# Patient Record
Sex: Female | Born: 1970 | Race: White | Hispanic: No | Marital: Single | Smoking: Current every day smoker
Health system: Southern US, Community
[De-identification: ages and names within clinical notes are randomized; demographics above are authoritative.]

## PROBLEM LIST (undated history)

## (undated) DIAGNOSIS — I10 Essential (primary) hypertension: Secondary | ICD-10-CM

## (undated) DIAGNOSIS — F329 Major depressive disorder, single episode, unspecified: Secondary | ICD-10-CM

## (undated) DIAGNOSIS — F32A Depression, unspecified: Secondary | ICD-10-CM

## (undated) DIAGNOSIS — F419 Anxiety disorder, unspecified: Secondary | ICD-10-CM

---

## 2005-01-04 ENCOUNTER — Observation Stay: Payer: Self-pay | Admitting: Obstetrics and Gynecology

## 2005-01-28 ENCOUNTER — Ambulatory Visit: Payer: Self-pay

## 2005-04-05 ENCOUNTER — Observation Stay: Payer: Self-pay | Admitting: Obstetrics and Gynecology

## 2006-07-10 ENCOUNTER — Emergency Department: Payer: Self-pay | Admitting: Emergency Medicine

## 2006-07-10 ENCOUNTER — Other Ambulatory Visit: Payer: Self-pay

## 2006-08-10 ENCOUNTER — Ambulatory Visit: Payer: Self-pay | Admitting: Family Medicine

## 2006-08-28 ENCOUNTER — Other Ambulatory Visit: Payer: Self-pay

## 2006-08-28 ENCOUNTER — Emergency Department: Payer: Self-pay | Admitting: Unknown Physician Specialty

## 2007-07-10 ENCOUNTER — Emergency Department: Payer: Self-pay | Admitting: Unknown Physician Specialty

## 2007-10-28 ENCOUNTER — Emergency Department: Payer: Self-pay | Admitting: Internal Medicine

## 2008-01-11 ENCOUNTER — Emergency Department: Payer: Self-pay | Admitting: Emergency Medicine

## 2008-05-08 ENCOUNTER — Emergency Department: Payer: Self-pay | Admitting: Emergency Medicine

## 2008-06-22 ENCOUNTER — Ambulatory Visit: Payer: Self-pay | Admitting: Internal Medicine

## 2008-11-08 IMAGING — US US RENAL KIDNEY
1 series · 17 of 25 positions shown · non-contrast
Comparison: none

REASON FOR EXAM: Pelvic tenderness, history of ovarian cyst, hematuria,
flank pain
COMMENTS:

PROCEDURE:     US  - US KIDNEY  - June 22, 2008  [DATE]
RESULT:     The kidneys exhibit normal echotexture and contour. The RIGHT
kidney measures 10.0 cm in length as does the LEFT kidney. There is no
evidence of hydronephrosis. Survey views of urinary bladder are normal.

[Series 1: us renal kidney · 17 of 25 slices shown]
[im 1/25]
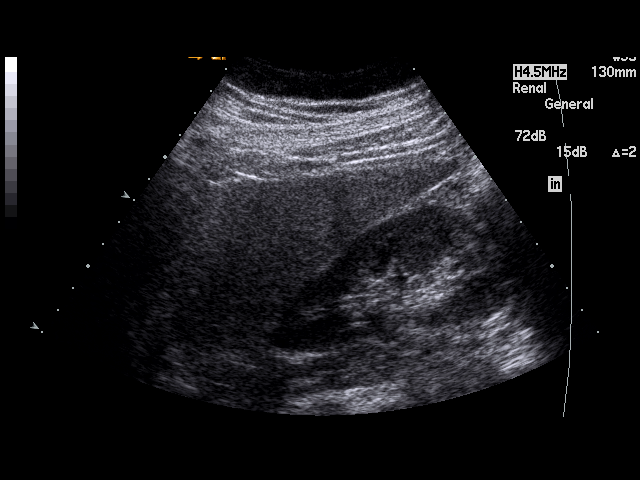
[im 3/25]
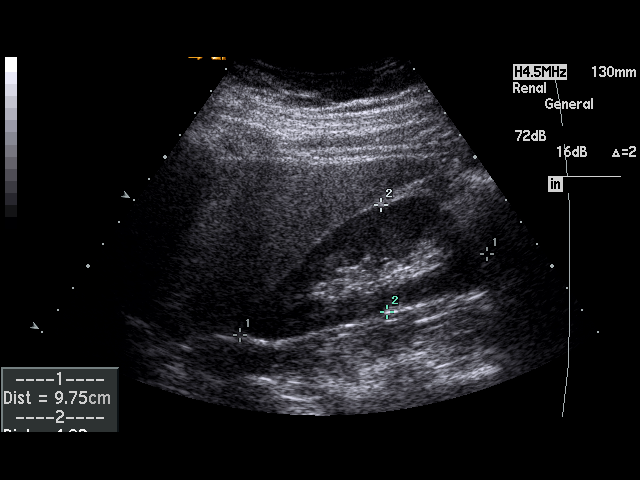
[im 4/25]
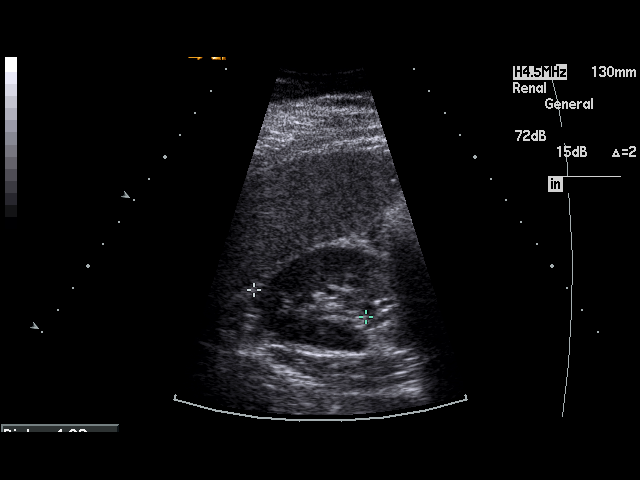
[im 6/25]
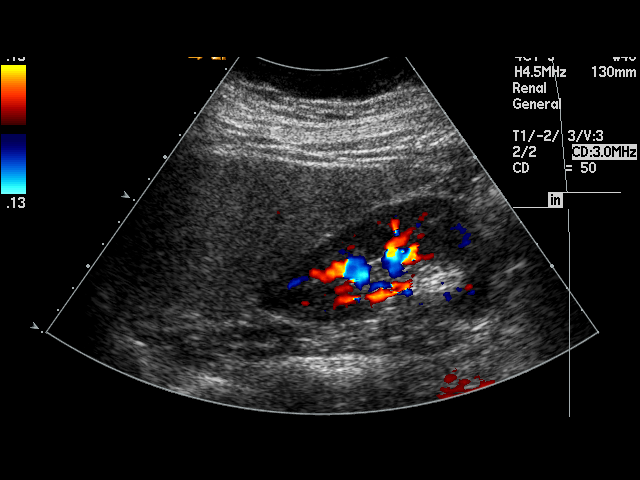
[im 7/25]
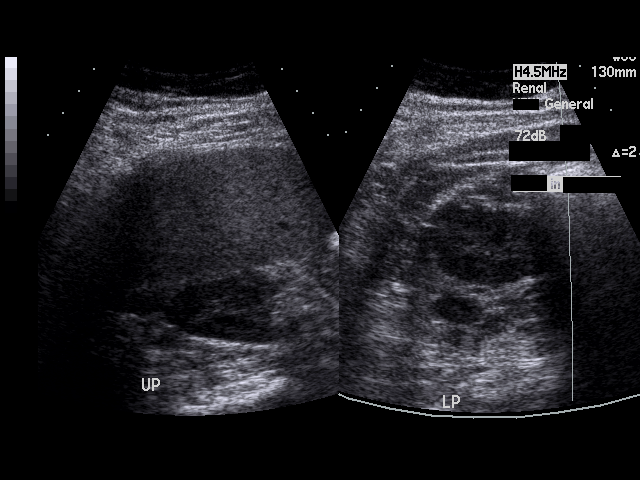
[im 9/25]
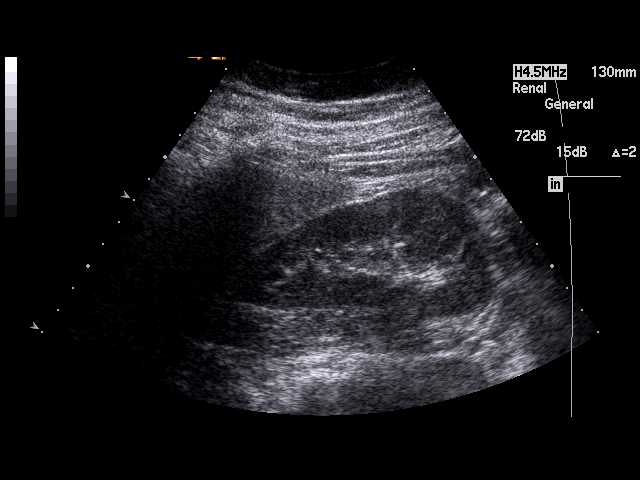
[im 10/25]
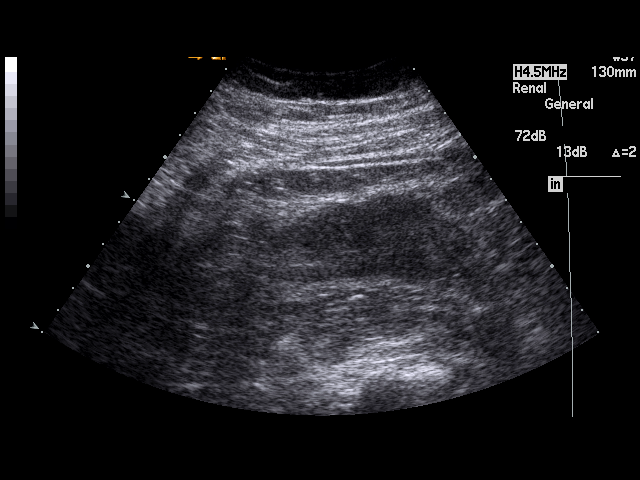
[im 12/25]
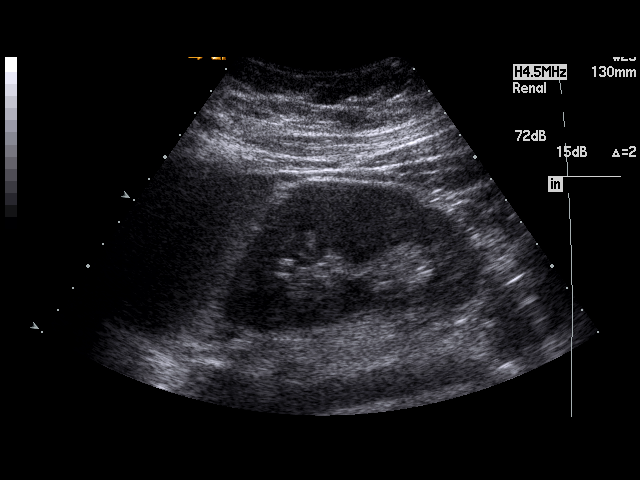
[im 13/25]
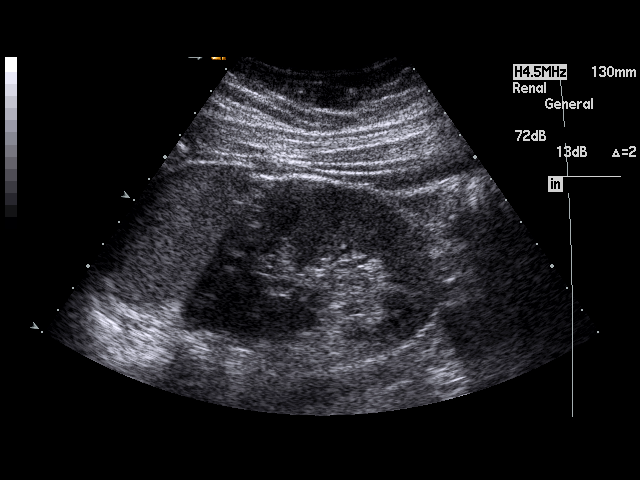
[im 14/25]
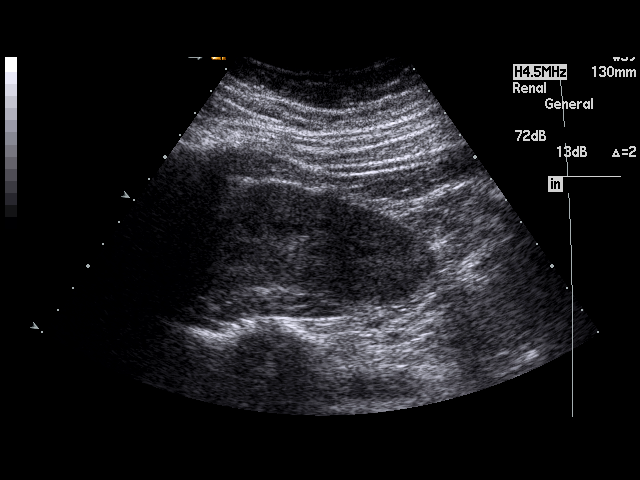
[im 16/25]
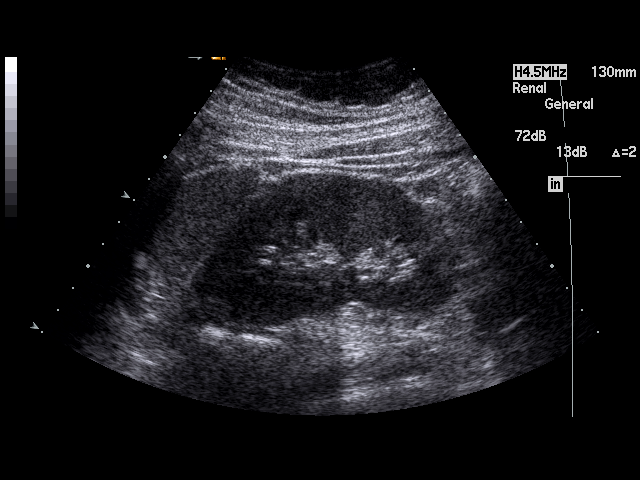
[im 17/25]
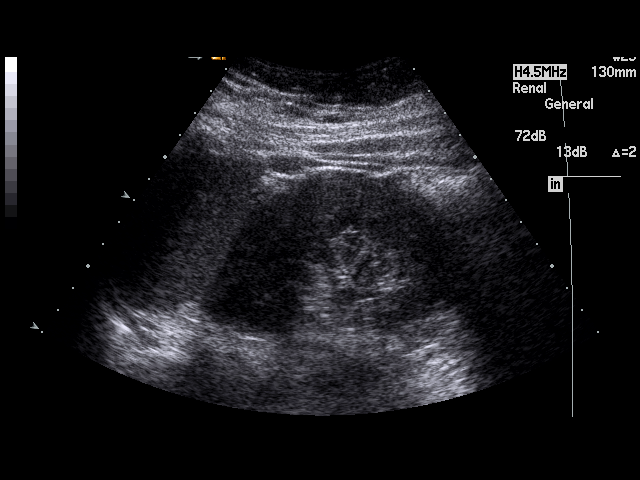
[im 19/25]
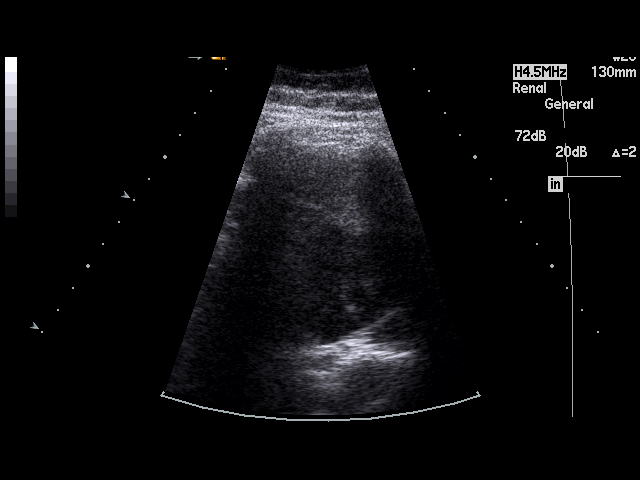
[im 20/25]
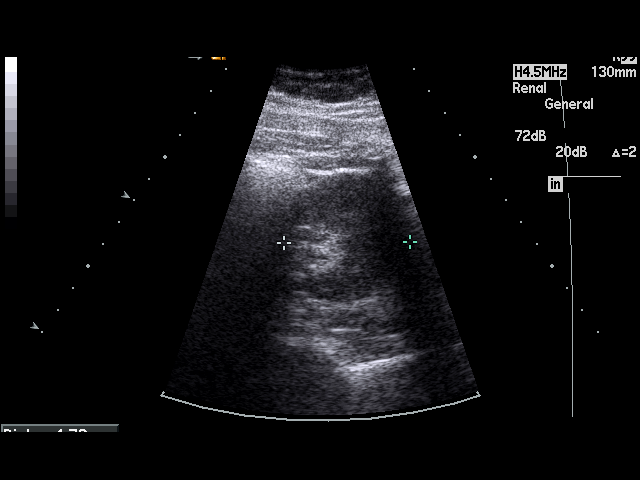
[im 22/25]
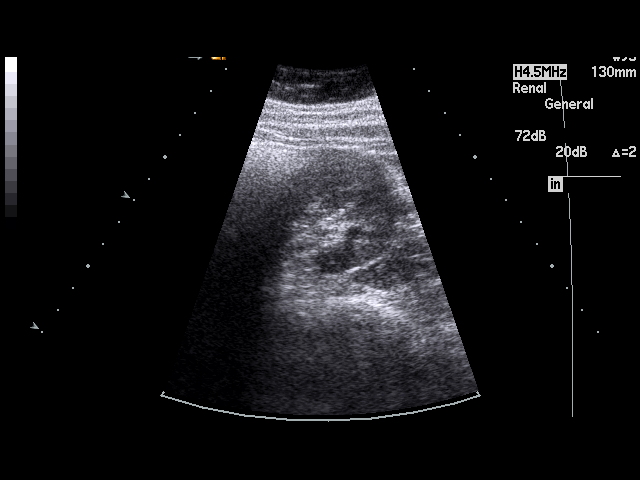
[im 23/25]
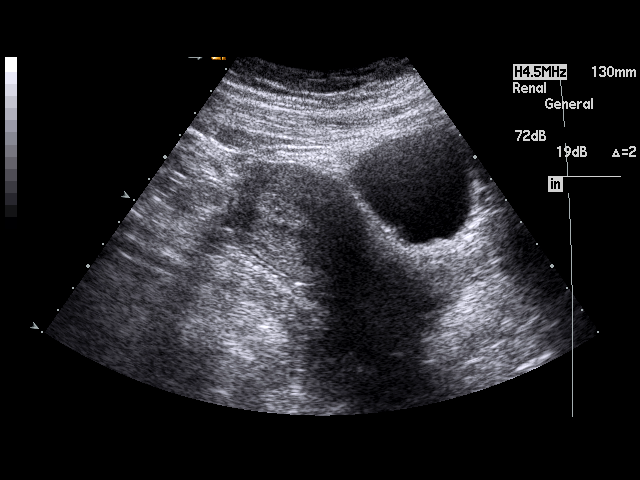
[im 25/25]
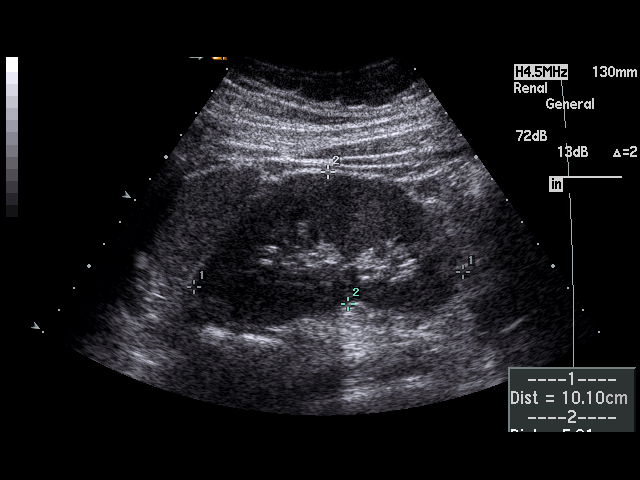

[17 of 25 positions shown; findings below may reference images not displayed]

IMPRESSION: I see no finding on this study to explain the patient's
hematuria. If the hematuria persists and remains unexplained, a triphasic CT
scan of the abdomen and pelvis would be the most useful next step.

## 2009-03-14 ENCOUNTER — Emergency Department: Payer: Self-pay | Admitting: Emergency Medicine

## 2016-07-14 ENCOUNTER — Inpatient Hospital Stay
Admission: EM | Admit: 2016-07-14 | Discharge: 2016-07-18 | DRG: 881 | Disposition: A | Discharge status: Home / Self Care | Payer: Medicaid Other | Source: Other Acute Inpatient Hospital | Attending: Psychiatry | Admitting: Psychiatry

## 2016-07-14 ENCOUNTER — Encounter: Payer: Self-pay | Admitting: *Deleted

## 2016-07-14 DIAGNOSIS — F17200 Nicotine dependence, unspecified, uncomplicated: Secondary | ICD-10-CM | POA: Diagnosis not present

## 2016-07-14 DIAGNOSIS — Z5181 Encounter for therapeutic drug level monitoring: Secondary | ICD-10-CM | POA: Diagnosis not present

## 2016-07-14 DIAGNOSIS — Z818 Family history of other mental and behavioral disorders: Secondary | ICD-10-CM | POA: Diagnosis not present

## 2016-07-14 DIAGNOSIS — I10 Essential (primary) hypertension: Secondary | ICD-10-CM

## 2016-07-14 DIAGNOSIS — F329 Major depressive disorder, single episode, unspecified: Principal | ICD-10-CM | POA: Diagnosis present

## 2016-07-14 DIAGNOSIS — F17210 Nicotine dependence, cigarettes, uncomplicated: Secondary | ICD-10-CM | POA: Diagnosis present

## 2016-07-14 DIAGNOSIS — S069X9A Unspecified intracranial injury with loss of consciousness of unspecified duration, initial encounter: Secondary | ICD-10-CM | POA: Diagnosis present

## 2016-07-14 DIAGNOSIS — J449 Chronic obstructive pulmonary disease, unspecified: Secondary | ICD-10-CM | POA: Diagnosis not present

## 2016-07-14 DIAGNOSIS — F4310 Post-traumatic stress disorder, unspecified: Secondary | ICD-10-CM | POA: Diagnosis present

## 2016-07-14 DIAGNOSIS — G4700 Insomnia, unspecified: Secondary | ICD-10-CM | POA: Diagnosis present

## 2016-07-14 DIAGNOSIS — G8929 Other chronic pain: Secondary | ICD-10-CM | POA: Diagnosis present

## 2016-07-14 DIAGNOSIS — F332 Major depressive disorder, recurrent severe without psychotic features: Secondary | ICD-10-CM | POA: Diagnosis not present

## 2016-07-14 DIAGNOSIS — Z888 Allergy status to other drugs, medicaments and biological substances status: Secondary | ICD-10-CM | POA: Diagnosis not present

## 2016-07-14 DIAGNOSIS — Z79899 Other long term (current) drug therapy: Secondary | ICD-10-CM | POA: Diagnosis not present

## 2016-07-14 DIAGNOSIS — G589 Mononeuropathy, unspecified: Secondary | ICD-10-CM | POA: Diagnosis present

## 2016-07-14 DIAGNOSIS — Z716 Tobacco abuse counseling: Secondary | ICD-10-CM

## 2016-07-14 DIAGNOSIS — S069XAA Unspecified intracranial injury with loss of consciousness status unknown, initial encounter: Secondary | ICD-10-CM

## 2016-07-14 HISTORY — DX: Depression, unspecified: F32.A

## 2016-07-14 HISTORY — DX: Anxiety disorder, unspecified: F41.9

## 2016-07-14 HISTORY — DX: Major depressive disorder, single episode, unspecified: F32.9

## 2016-07-14 HISTORY — DX: Essential (primary) hypertension: I10

## 2016-07-14 MED ORDER — NICOTINE 21 MG/24HR TD PT24
21.0000 mg | MEDICATED_PATCH | Freq: Every day | TRANSDERMAL | Status: DC
  Administered 2016-07-15 – 2016-07-16 (×2): 21 mg via TRANSDERMAL
  Filled 2016-07-14 (×3): qty 1

## 2016-07-14 MED ORDER — ACETAMINOPHEN 325 MG PO TABS: 650.0000 mg | ORAL_TABLET | Freq: Four times a day (QID) | ORAL | Status: DC | PRN

## 2016-07-14 MED ORDER — HYDROXYZINE HCL 25 MG PO TABS
25.0000 mg | ORAL_TABLET | Freq: Three times a day (TID) | ORAL | Status: DC | PRN
  Administered 2016-07-14 – 2016-07-15 (×2): 25 mg via ORAL
  Filled 2016-07-14 (×2): qty 1

## 2016-07-14 MED ORDER — MAGNESIUM HYDROXIDE 400 MG/5ML PO SUSP: 30.0000 mL | Freq: Every day | ORAL | Status: DC | PRN

## 2016-07-14 MED ORDER — IBUPROFEN 600 MG PO TABS
600.0000 mg | ORAL_TABLET | Freq: Four times a day (QID) | ORAL | Status: DC | PRN
  Administered 2016-07-14 – 2016-07-17 (×8): 600 mg via ORAL
  Filled 2016-07-14 (×8): qty 1

## 2016-07-14 MED ORDER — TRAZODONE HCL 50 MG PO TABS
50.0000 mg | ORAL_TABLET | Freq: Every evening | ORAL | Status: DC | PRN
  Administered 2016-07-14: 50 mg via ORAL
  Filled 2016-07-14: qty 1

## 2016-07-14 MED ORDER — ACETAMINOPHEN 500 MG PO TABS
1000.0000 mg | ORAL_TABLET | Freq: Four times a day (QID) | ORAL | Status: DC | PRN
  Administered 2016-07-15 – 2016-07-18 (×3): 1000 mg via ORAL
  Filled 2016-07-14 (×3): qty 2

## 2016-07-14 MED ORDER — ALUM & MAG HYDROXIDE-SIMETH 200-200-20 MG/5ML PO SUSP: 30.0000 mL | ORAL | Status: DC | PRN
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
## 2016-07-14 NOTE — BH Assessment (Signed)
[facility] been [facility]  [facility]. Accepting physician is Dr. Lucianne MussKumar on the behalf of Dr. Ardyth HarpsHernandez.  Attending Physician will be Dr. Ardyth HarpsHernandez.  Patient [facility] room [facility], [facility] Charge Nurse Buena VistaGwen F.  Call report to [phone].  Representative/Transfer Coordinator is Caress Reffitt.  [facility] Staff Lindie Spruce(Meghan S., Social Worker & Nira Retortina T., [facility]) made aware of acceptance.

## 2016-07-14 NOTE — Tx Team (Signed)
Initial Treatment Plan 07/14/2016 5:21 PM Helen Valdez [id number]    PATIENT STRESSORS: Financial difficulties Legal issue   PATIENT STRENGTHS: Average or above average intelligence Capable of independent living   PATIENT IDENTIFIED PROBLEMS: "start to feel better"  Legal issues                   DISCHARGE CRITERIA:  Improved stabilization in mood, thinking, and/or behavior Safe-care adequate arrangements made  PRELIMINARY DISCHARGE PLAN: Return to previous living arrangement  PATIENT/FAMILY INVOLVEMENT: This treatment plan has been presented to and reviewed with the patient, Helen ShropshireVickie L Valdez, and/or family member,  .  The patient and family have been given the opportunity to ask questions and make suggestions.  Elige RadonCobb, Ruthy Forry B, RN 07/14/2016, 5:21 PM

## 2016-07-14 NOTE — Plan of Care (Signed)
Problem: Safety: Goal: Ability to disclose and discuss suicidal ideas will improve Outcome: Progressing Pt rates depression 8/10. She reports passive SI and contracts for safety.   Problem: Safety: Goal: Periods of time without injury will increase Outcome: Progressing Pt remains free from harm.
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
## 2016-07-15 ENCOUNTER — Encounter: Payer: Self-pay | Admitting: Psychiatry

## 2016-07-15 DIAGNOSIS — S069XAA Unspecified intracranial injury with loss of consciousness status unknown, initial encounter: Secondary | ICD-10-CM

## 2016-07-15 DIAGNOSIS — S069X9A Unspecified intracranial injury with loss of consciousness of unspecified duration, initial encounter: Secondary | ICD-10-CM

## 2016-07-15 DIAGNOSIS — F332 Major depressive disorder, recurrent severe without psychotic features: Secondary | ICD-10-CM

## 2016-07-15 DIAGNOSIS — F4310 Post-traumatic stress disorder, unspecified: Secondary | ICD-10-CM

## 2016-07-15 DIAGNOSIS — F17200 Nicotine dependence, unspecified, uncomplicated: Secondary | ICD-10-CM

## 2016-07-15 DIAGNOSIS — I10 Essential (primary) hypertension: Secondary | ICD-10-CM

## 2016-07-15 DIAGNOSIS — J449 Chronic obstructive pulmonary disease, unspecified: Secondary | ICD-10-CM

## 2016-07-15 LAB — PREGNANCY, URINE: PREG TEST UR: NEGATIVE

## 2016-07-15 MED ORDER — ALBUTEROL SULFATE HFA 108 (90 BASE) MCG/ACT IN AERS: 1.0000 | INHALATION_SPRAY | RESPIRATORY_TRACT | Status: DC | PRN

## 2016-07-15 MED ORDER — TRAZODONE HCL 50 MG PO TABS
50.0000 mg | ORAL_TABLET | Freq: Every day | ORAL | Status: DC
  Administered 2016-07-15: 50 mg via ORAL
  Filled 2016-07-15: qty 1

## 2016-07-15 MED ORDER — BLISTEX MEDICATED EX OINT
TOPICAL_OINTMENT | CUTANEOUS | Status: DC | PRN
  Filled 2016-07-15: qty 6.3

## 2016-07-15 MED ORDER — FLUOXETINE HCL 10 MG PO CAPS
10.0000 mg | ORAL_CAPSULE | Freq: Every day | ORAL | Status: DC
  Administered 2016-07-15 – 2016-07-18 (×4): 10 mg via ORAL
  Filled 2016-07-15 (×4): qty 1

## 2016-07-15 MED ORDER — PROMETHAZINE HCL 25 MG PO TABS: 12.5000 mg | ORAL_TABLET | Freq: Four times a day (QID) | ORAL | Status: DC | PRN

## 2016-07-15 MED ORDER — PRAZOSIN HCL 1 MG PO CAPS
1.0000 mg | ORAL_CAPSULE | Freq: Every day | ORAL | Status: DC
  Administered 2016-07-15: 1 mg via ORAL
  Filled 2016-07-15 (×2): qty 1

## 2016-07-15 MED ORDER — LISINOPRIL 5 MG PO TABS
5.0000 mg | ORAL_TABLET | Freq: Every day | ORAL | Status: DC
  Administered 2016-07-15 – 2016-07-18 (×4): 5 mg via ORAL
  Filled 2016-07-15 (×4): qty 1

## 2016-07-15 MED ORDER — TIOTROPIUM BROMIDE MONOHYDRATE 18 MCG IN CAPS
18.0000 ug | ORAL_CAPSULE | Freq: Every day | RESPIRATORY_TRACT | Status: DC
  Administered 2016-07-15 – 2016-07-18 (×4): 18 ug via RESPIRATORY_TRACT
  Filled 2016-07-15: qty 5

## 2016-07-15 MED ORDER — HYDROXYZINE HCL 25 MG PO TABS
25.0000 mg | ORAL_TABLET | Freq: Four times a day (QID) | ORAL | Status: DC | PRN
  Administered 2016-07-15 – 2016-07-18 (×8): 25 mg via ORAL
  Filled 2016-07-15 (×8): qty 1

## 2016-07-15 NOTE — [facility] Group Notes (Signed)
[facility] Group Notes:  (Nursing/MHT/Case Management/Adjunct)  Date:  07/15/2016  Time:  4:57 AM  Type of Therapy:  Psychoeducational Skills  Participation Level:  Did Not Attend  Summary of Progress/Problems:  Helen MilroyLaquanda Valdez Helen Valdez 07/15/2016, 4:57 AM
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
## 2016-07-15 NOTE — [facility] Suicide Risk Assessment (Signed)
[facility]   Nursing information obtained from:    Demographic factors:    Current Mental Status:    Loss Factors:    Historical Factors:    Risk Reduction Factors:     Total Time spent with patient: 1 hour Principal Problem: MDD (major depressive disorder) (HCC) Diagnosis:   Patient Active Problem List   Diagnosis Date Noted  . TBI (traumatic brain injury) (HCC) [S06.9X9A] 07/15/2016  . MDD (major depressive disorder) (HCC) [F32.9] 07/14/2016  . COPD (chronic obstructive pulmonary disease) (HCC) [J44.9] 07/14/2016  . HTN (hypertension) [I10] 07/14/2016  . Tobacco use disorder [F17.200] 07/14/2016   Subjective Data:   Continued Clinical Symptoms:  Alcohol Use Disorder Identification Test Final Score (AUDIT): 0 The "Alcohol Use Disorders Identification Test", Guidelines for Use in Primary Care, Second Edition.  World [employer] [facility](WHO). Score between 0-7:  no or low risk or alcohol related problems. Score between 8-15:  moderate risk of alcohol related problems. Score between 16-19:  high risk of alcohol related problems. Score 20 or above:  warrants further diagnostic evaluation for alcohol dependence and treatment.   CLINICAL FACTORS:   Severe Anxiety and/or Agitation Depression:   Hopelessness Previous Psychiatric Diagnoses and Treatments Medical Diagnoses and Treatments/Surgeries    Psychiatric Specialty Exam: Physical Exam  ROS  Blood pressure (!) 129/92, pulse 82, temperature 98.9 F (37.2 C), temperature source Oral, resp. rate 18, height 5\' 2"  (1.575 m), weight 54.4 kg (120 lb), SpO2 100 %.Body mass index is 21.95 kg/m.                                                    Sleep:  Number of Hours: 7.25      COGNITIVE FEATURES THAT CONTRIBUTE TO RISK:  Closed-mindedness    SUICIDE RISK:   Moderate:  Frequent suicidal ideation with limited intensity, and duration, some specificity in terms of plans, no  associated intent, good self-control, limited dysphoria/symptomatology, some risk factors present, and identifiable protective factors, including available and accessible social support.   [facility]  I certify that inpatient services furnished can reasonably be expected to improve the patient's condition.  Jimmy FootmanHernandez-Gonzalez,  Kenzie Thoreson, MD 07/15/2016, 12:38 PM

## 2016-07-15 NOTE — Progress Notes (Signed)
Per self inventory patient denies SI although reports to this writer passive thoughts of SI but contracts for safety. Affect blunted.  Rates depression as 4/10, hopelessness as a 4/10 and anxiety as a 4/10.  Denies AVH and HI.  Presents frequently requesting medication for anxiety and also pain.  Reluctant to talk today.  Support and encouragement offered.  Safety maintained.

## 2016-07-15 NOTE — [facility] Group Notes (Signed)
[facility] Group Notes:  (Nursing/MHT/Case Management/Adjunct)  Date:  07/15/2016  Time:  11:03 PM  Type of Therapy:  Evening Wrap-up Group  Participation Level:  Did Not Attend  Participation Quality:  N/A  Affect:  N/A  Cognitive:  N/A  Insight:  None  Engagement in Group:  Did Not Attend  Modes of Intervention:  Activity and Discussion  Summary of Progress/Problems:  Tomasita MorrowChelsea Nanta [name] 07/15/2016, 11:03 PM
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
## 2016-07-15 NOTE — Care Management (Signed)
Helen Valdez was not interested in talking or the services provided. However, the Candelaria StagersChaplin will continue rounds and look in on her later.

## 2016-07-15 NOTE — Tx Team (Signed)
Helen Valdez  Pt name [id number]    MDD (major depressive disorder) (HCC)  Principal Problem Principal Problem:   MDD (major depressive disorder) (HCC) Active Problems:   COPD (chronic obstructive pulmonary disease) (HCC)   HTN (hypertension)   Tobacco use disorder   TBI (traumatic brain injury) (HCC)  Secondary Dx/Hospital Problem List Current Facility-Administered Medications  Medication Dose Route Frequency Provider Last Rate Last Dose  . acetaminophen (TYLENOL) tablet 1,000 mg  1,000 mg Oral Q6H PRN Jimmy Footman, MD      . albuterol (PROVENTIL HFA;VENTOLIN HFA) 108 (90 Base) MCG/ACT inhaler 1-2 puff  1-2 puff Inhalation Q4H PRN Jimmy Footman, MD      . alum & mag hydroxide-simeth (MAALOX/MYLANTA) 200-200-20 MG/5ML suspension 30 mL  30 mL Oral Q4H PRN Jimmy Footman, MD      . FLUoxetine (PROZAC) capsule 10 mg  10 mg Oral Daily Jimmy Footman, MD   10 mg at 07/15/16 1041  . hydrOXYzine (ATARAX/VISTARIL) tablet 25 mg  25 mg Oral TID PRN Jimmy Footman, MD   25 mg at 07/15/16 1043  . ibuprofen (ADVIL,MOTRIN) tablet 600 mg  600 mg Oral Q6H PRN Jimmy Footman, MD   600 mg at 07/15/16 1042  . lip balm (BLISTEX) ointment   Topical PRN Jimmy Footman, MD      . lisinopril (PRINIVIL,ZESTRIL) tablet 5 mg  5 mg Oral Daily Jimmy Footman, MD   5 mg at 07/15/16 1041  . magnesium hydroxide (MILK OF MAGNESIA) suspension 30 mL  30 mL Oral Daily PRN Jimmy Footman, MD      . nicotine (NICODERM CQ - dosed in mg/24 hours) patch 21 mg  21 mg Transdermal Daily Jimmy Footman, MD   21 mg at 07/15/16 1041  . prazosin (MINIPRESS) capsule 1 mg  1 mg Oral QHS Jimmy Footman, MD      . traZODone (DESYREL) tablet 50 mg  50 mg Oral QHS Jimmy Footman, MD         Current Meds Prescriptions Prior to Admission  Medication Sig Dispense Refill Last Dose  . clonazePAM (KLONOPIN) 1 MG  tablet Take 1 mg by mouth 3 (three) times daily as needed for anxiety.   07/14/2016 at Unknown time  . gabapentin (NEURONTIN) 300 MG capsule Take 300 mg by mouth.   07/14/2016 at Unknown time  . lisinopril-hydrochlorothiazide (PRINZIDE,ZESTORETIC) 20-12.5 MG tablet Take 1 tablet by mouth daily.   07/14/2016 at Unknown time  . oxyCODONE-acetaminophen (PERCOCET) 10-325 MG tablet Take 1 tablet by mouth 3 (three) times daily as needed for pain.   07/14/2016 at Unknown time  . QUEtiapine (SEROQUEL) 50 MG tablet Take 50 mg by mouth 2 (two) times daily.   07/14/2016 at Unknown time  . tiotropium (SPIRIVA) 18 MCG inhalation capsule Place 18 mcg into inhaler and inhale daily.   07/14/2016 at Unknown time     Prior to Admission Meds   Interdisciplinary Treatment and Diagnostic Plan Update  07/15/2016 Time of Session: 11:37 AM  CAMEO SCHMIESING MRN: [id number]  Principal Diagnosis: MDD (major depressive disorder) (HCC)  Secondary Diagnoses: Principal Problem:   MDD (major depressive disorder) (HCC) Active Problems:   COPD (chronic obstructive pulmonary disease) (HCC)   HTN (hypertension)   Tobacco use disorder   TBI (traumatic brain injury) (HCC)   Current Medications:  Current Facility-Administered Medications  Medication Dose Route Frequency Provider Last Rate Last Dose  . acetaminophen (TYLENOL) tablet 1,000 mg  1,000 mg Oral Q6H PRN Jimmy Footman, MD      .  albuterol (PROVENTIL HFA;VENTOLIN HFA) 108 (90 Base) MCG/ACT inhaler 1-2 puff  1-2 puff Inhalation Q4H PRN Jimmy Footman, MD      . alum & mag hydroxide-simeth (MAALOX/MYLANTA) 200-200-20 MG/5ML suspension 30 mL  30 mL Oral Q4H PRN Jimmy Footman, MD      . FLUoxetine (PROZAC) capsule 10 mg  10 mg Oral Daily Jimmy Footman, MD   10 mg at 07/15/16 1041  . hydrOXYzine (ATARAX/VISTARIL) tablet 25 mg  25 mg Oral TID PRN Jimmy Footman, MD   25 mg at 07/15/16 1043  . ibuprofen (ADVIL,MOTRIN)  tablet 600 mg  600 mg Oral Q6H PRN Jimmy Footman, MD   600 mg at 07/15/16 1042  . lip balm (BLISTEX) ointment   Topical PRN Jimmy Footman, MD      . lisinopril (PRINIVIL,ZESTRIL) tablet 5 mg  5 mg Oral Daily Jimmy Footman, MD   5 mg at 07/15/16 1041  . magnesium hydroxide (MILK OF MAGNESIA) suspension 30 mL  30 mL Oral Daily PRN Jimmy Footman, MD      . nicotine (NICODERM CQ - dosed in mg/24 hours) patch 21 mg  21 mg Transdermal Daily Jimmy Footman, MD   21 mg at 07/15/16 1041  . prazosin (MINIPRESS) capsule 1 mg  1 mg Oral QHS Jimmy Footman, MD      . traZODone (DESYREL) tablet 50 mg  50 mg Oral QHS Jimmy Footman, MD        PTA Medications: Prescriptions Prior to Admission  Medication Sig Dispense Refill Last Dose  . clonazePAM (KLONOPIN) 1 MG tablet Take 1 mg by mouth 3 (three) times daily as needed for anxiety.   07/14/2016 at Unknown time  . gabapentin (NEURONTIN) 300 MG capsule Take 300 mg by mouth.   07/14/2016 at Unknown time  . lisinopril-hydrochlorothiazide (PRINZIDE,ZESTORETIC) 20-12.5 MG tablet Take 1 tablet by mouth daily.   07/14/2016 at Unknown time  . oxyCODONE-acetaminophen (PERCOCET) 10-325 MG tablet Take 1 tablet by mouth 3 (three) times daily as needed for pain.   07/14/2016 at Unknown time  . QUEtiapine (SEROQUEL) 50 MG tablet Take 50 mg by mouth 2 (two) times daily.   07/14/2016 at Unknown time  . tiotropium (SPIRIVA) 18 MCG inhalation capsule Place 18 mcg into inhaler and inhale daily.   07/14/2016 at Unknown time    Treatment Modalities: Medication Management, Group therapy, Case management,  1 to 1 session with clinician, Psychoeducation, Recreational therapy.   Physician Treatment Plan for Primary Diagnosis: MDD (major depressive disorder) (HCC) Long Term Goal(s): Improvement in symptoms so as ready for discharge  Short Term Goals: Ability to verbalize feelings will improve and Ability to  identify and develop effective coping behaviors will improve  Medication Management: Evaluate patient's response, side effects, and tolerance of medication regimen.  Therapeutic Interventions: 1 to 1 sessions, Unit Group sessions and Medication administration.  Evaluation of Outcomes: Progressing  Physician Treatment Plan for Secondary Diagnosis: Principal Problem:   MDD (major depressive disorder) (HCC) Active Problems:   COPD (chronic obstructive pulmonary disease) (HCC)   HTN (hypertension)   Tobacco use disorder   TBI (traumatic brain injury) (HCC)   Long Term Goal(s): Improvement in symptoms so as ready for discharge  Short Term Goals: Ability to identify changes in lifestyle to reduce recurrence of condition will improve and Ability to identify and develop effective coping behaviors will improve  Medication Management: Evaluate patient's response, side effects, and tolerance of medication regimen.  Therapeutic Interventions: 1 to 1 sessions, Unit Group sessions and  Medication administration.  Evaluation of Outcomes: Progressing   RN Treatment Plan for Primary Diagnosis: MDD (major depressive disorder) (HCC) Long Term Goal(s): Knowledge of disease and therapeutic regimen to maintain health will improve  Short Term Goals: Ability to verbalize frustration and anger appropriately will improve and Ability to participate in decision making will improve  Medication Management: RN will administer medications as ordered by provider, will assess and evaluate patient's response and provide education to patient for prescribed medication. RN will report any adverse and/or side effects to prescribing provider.  Therapeutic Interventions: 1 on 1 counseling sessions, Psychoeducation, Medication administration, Evaluate responses to treatment, Monitor vital signs and CBGs as ordered, Perform/monitor CIWA, COWS, AIMS and Fall Risk screenings as ordered, Perform wound care treatments as  ordered.  Evaluation of Outcomes: Progressing   LCSW Treatment Plan for Primary Diagnosis: MDD (major depressive disorder) (HCC) Long Term Goal(s): Safe transition to appropriate next level of care at discharge, Engage patient in therapeutic group addressing interpersonal concerns.  Short Term Goals: Engage patient in aftercare planning with referrals and resources, Increase social support and Facilitate acceptance of mental health diagnosis and concerns  Therapeutic Interventions: Assess for all discharge needs, 1 to 1 time with Social worker, Explore available resources and support systems, Assess for adequacy in community support network, Educate family and significant other(s) on suicide prevention, Complete Psychosocial Assessment, Interpersonal group therapy.  Evaluation of Outcomes: Progressing   Progress in Treatment: Attending groups: Yes Participating in groups: Yes Taking medication as prescribed: Yes, MD continues to assess for medication changes as needed Toleration medication: Yes, no side effects reported at this time Family/Significant other contact made:  Patient understands diagnosis:  Discussing patient identified problems/goals with staff: Yes Medical problems stabilized or resolved: Yes Denies suicidal/homicidal ideation:  Issues/concerns per patient self-inventory: None Other: N/A  New problem(s) identified: None identified at this time.   New Short Term/Long Term Goal(s): None identified at this time.   Discharge Plan or Barriers:   Reason for Continuation of Hospitalization: Anxiety Delusions  Depression Hallucinations Homicidal ideation [place] Medical Issues Medication stabilization Suicidal ideation Withdrawal symptoms  Estimated Length of Stay: 3-5 days  Attendees: Patient: Rejeana BrockVickie Gortney  07/15/2016  11:37 AM  Physician: Radene JourneyAndrea Hernandez 07/15/2016  11:37 AM  Nursing: Hulan AmatoGwen Farrish  07/15/2016  11:37 AM  RN Care Manager: 07/15/2016  11:37 AM   Social Worker: Hampton AbbotKadijah Atara Paterson  07/15/2016  11:37 AM  Recreational Therapist:  07/15/2016  11:37 AM  Other: PA student, Sophia  07/15/2016  11:37 AM  Other:  07/15/2016  11:37 AM  Other: 07/15/2016  11:37 AM    Scribe for Treatment Team: Lynden OxfordKadijah R. Parthenia Tellefsen, MSW, LCSW-A

## 2016-07-15 NOTE — [facility] Suicide Risk Assessment (Signed)
[facility] INPATIENT:  Family/Significant Other Suicide Prevention Education  Suicide Prevention Education:  Patient Refusal for Family/Significant Other Suicide Prevention Education: The patient Helen Valdez has refused to provide written consent for family/significant other to be provided Family/Significant Other Suicide Prevention Education during admission and/or prior to discharge.  Physician notified. Pt does not want family contacted - she prefers to talk to them herself.  Lynden OxfordKadijah R Juan Kissoon, MSW, LCSW-A 07/15/2016, 10:10 AM
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
## 2016-07-16 LAB — VITAMIN B12: VITAMIN B 12: 341 pg/mL (ref 180–914)

## 2016-07-16 LAB — TSH: TSH: 0.161 u[IU]/mL — ABNORMAL LOW (ref 0.350–4.500)

## 2016-07-16 MED ORDER — QUETIAPINE FUMARATE 25 MG PO TABS
50.0000 mg | ORAL_TABLET | Freq: Every day | ORAL | Status: DC
  Administered 2016-07-16 – 2016-07-17 (×2): 50 mg via ORAL
  Filled 2016-07-16 (×2): qty 2

## 2016-07-16 NOTE — Plan of Care (Signed)
Problem: Safety: Goal: Ability to disclose and discuss suicidal ideas will improve Outcome: Progressing Pt denied SI this evening.

## 2016-07-16 NOTE — [facility] Group Notes (Signed)
[facility] LCSW Group Therapy Note  Type of Therapy and Topic:  Group Therapy:  Goals Group: SMART Goals  Participation Level:  Pt did not attend group. CSW invited pt to group.   Description of Group:   The purpose of a daily goals group is to assist and guide patients in setting recovery/wellness-related goals.  The objective is to set goals as they relate to the crisis in which they were admitted. Patients will be using SMART goal modalities to set measurable goals.  Characteristics of realistic goals will be discussed and patients will be assisted in setting and processing how one will reach their goal. Facilitator will also assist patients in applying interventions and coping skills learned in psycho-education groups to the SMART goal and process how one will achieve defined goal.  Therapeutic Goals: -Patients will develop and document one goal related to or their crisis in which brought them into treatment. -Patients will be guided by LCSW using SMART goal setting modality in how to set a measurable, attainable, realistic and time sensitive goal.  -Patients will process barriers in reaching goal. -Patients will process interventions in how to overcome and successful in reaching goal.   Summary of Patient Progress:  Patient Goal: Pt did not attend group. CSW invited pt to group.    Therapeutic Modalities:   Motivational Interviewing Engineer, manufacturing systemsCognitive Behavioral Therapy Crisis Intervention Model SMART goals setting  Helma Argyle G. Garnette [name] MSW, [facility] HospitalCSWA 07/16/2016 12:42 PM

## 2016-07-16 NOTE — [facility] Group Notes (Signed)
[facility] Group Notes:  (Nursing/MHT/Case Management/Adjunct)  Date:  07/16/2016  Time:  6:48 PM  Type of Therapy:  Psychoeducational Skills  Participation Level:  Active  Participation Quality:  Appropriate, Sharing and Supportive  Affect:  Appropriate  Cognitive:  Appropriate  Insight:  Appropriate  Engagement in Group:  Engaged and Supportive  Modes of Intervention:  Discussion, Education and Support  Summary of Progress/Problems:  Noelle PennerKristen J Coleson Kant 07/16/2016, 6:48 PM

## 2016-07-16 NOTE — Progress Notes (Addendum)
[facility] MD Progress Note  07/16/2016 2:58 PM Helen Valdez  MRN:  [id number] Subjective:  Pt admitted on Tuesday for worsening depression and suicidality due to a multitude of stressors. She was recently incarcerated due to accusations of child abuse. The patient was unable to pay the bond and was in jail for about 6 months however the charges were eventually dismissed. She is on house arrest at this time, she explains that they were charges on her as her 44 year old had poor attendance to school. She violated her probation for these charges and therefore is now on house arrest. She has a h/o MDD, PTSD, and GAD.   Patient reports improved mood, depression and anxiety. She rates her depression and anxiety 3/10. She slept well last night, but reports having a nightmare (not related to her PTSD), and does not want to take the prazosin any more. She has a good appetite and denies SI/HI/AVH. She has not attended any groups.  She reports no side effects of the medications. Pt has some congestion and is is coughing up some clear phlegm. She denies fevers, chills, or sore throat. Pt told to notify staff if sxs worsen.  Per nursing staff: D: Observed pt in room on bed. Patient alert and oriented x4. Patient denies SI/HI/AVH. Pt affect is anxious. Pt stated his anxiety is 8/10 and depression 2/10. Pt stated her depression is "tremendously better." Pt stated she has no more "negative or negative thoughts or feelings." Pt did have some worries about medication. Pt c/o of mid back pain radiating down to legs A: Offered active listening and support. Provided therapeutic communication. Administered scheduled medications. Educated pt on medication. Gave tylenol prn for pain. R: Pt pleasant and cooperative. Pt medication compliant. Will continue Q15 min. checks. Safety maintained.   Principal Problem: MDD (major depressive disorder) (HCC) Diagnosis:   Patient Active Problem List   Diagnosis Date Noted  . TBI  (traumatic brain injury) (HCC) [S06.9X9A] 07/15/2016  . PTSD (post-traumatic stress disorder) [F43.10] 07/15/2016  . MDD (major depressive disorder) (HCC) [F32.9] 07/14/2016  . COPD (chronic obstructive pulmonary disease) (HCC) [J44.9] 07/14/2016  . HTN (hypertension) [I10] 07/14/2016  . Tobacco use disorder [F17.200] 07/14/2016   Total Time spent with patient: 30 minutes  Past Psychiatric History:   Past Medical History:  Past Medical History:  Diagnosis Date  . Anxiety   . Depression   . Hypertension    History reviewed. No pertinent surgical history. Family History: History reviewed. No pertinent family history. Family Psychiatric  History:  Social History:  History  Alcohol Use No     History  Drug Use No    Social History   Social History  . Marital status: Single    Spouse name: N/A  . Number of children: N/A  . Years of education: N/A   Social History Main Topics  . Smoking status: Current Every Day Smoker    Types: Cigarettes  . Smokeless tobacco: Never Used  . Alcohol use No  . Drug use: No  . Sexual activity: Yes   Other Topics Concern  . None   Social History Narrative  . None   Additional Social History:    Pain Medications: See PTA Prescriptions: See PTA Over the Counter: See PTA History of alcohol / drug use?: No history of alcohol / drug abuse Longest period of sobriety (when/how long): Have no past or current use of mind altering substances.  Sleep: Good  Appetite:  Good  Current Medications: Current Facility-Administered Medications  Medication Dose Route Frequency Provider Last Rate Last Dose  . acetaminophen (TYLENOL) tablet 1,000 mg  1,000 mg Oral Q6H PRN Jimmy Footman, MD   1,000 mg at 07/15/16 2136  . albuterol (PROVENTIL HFA;VENTOLIN HFA) 108 (90 Base) MCG/ACT inhaler 1-2 puff  1-2 puff Inhalation Q4H PRN Jimmy Footman, MD      . alum & mag hydroxide-simeth (MAALOX/MYLANTA)  200-200-20 MG/5ML suspension 30 mL  30 mL Oral Q4H PRN Jimmy Footman, MD      . FLUoxetine (PROZAC) capsule 10 mg  10 mg Oral Daily Jimmy Footman, MD   10 mg at 07/16/16 1204  . hydrOXYzine (ATARAX/VISTARIL) tablet 25 mg  25 mg Oral QID PRN Jimmy Footman, MD   25 mg at 07/16/16 1418  . ibuprofen (ADVIL,MOTRIN) tablet 600 mg  600 mg Oral Q6H PRN Jimmy Footman, MD   600 mg at 07/16/16 1201  . lip balm (BLISTEX) ointment   Topical PRN Jimmy Footman, MD      . lisinopril (PRINIVIL,ZESTRIL) tablet 5 mg  5 mg Oral Daily Jimmy Footman, MD   5 mg at 07/16/16 1204  . magnesium hydroxide (MILK OF MAGNESIA) suspension 30 mL  30 mL Oral Daily PRN Jimmy Footman, MD      . nicotine (NICODERM CQ - dosed in mg/24 hours) patch 21 mg  21 mg Transdermal Daily Jimmy Footman, MD   21 mg at 07/16/16 1204  . promethazine (PHENERGAN) tablet 12.5 mg  12.5 mg Oral Q6H PRN Jimmy Footman, MD      . tiotropium [facility]) inhalation capsule 18 mcg  18 mcg Inhalation Daily Jimmy Footman, MD   18 mcg at 07/16/16 1204  . traZODone (DESYREL) tablet 50 mg  50 mg Oral QHS Jimmy Footman, MD   50 mg at 07/15/16 2135    Lab Results:  Results for orders placed or performed during the hospital encounter of 07/14/16 (from the past 48 hour(s))  Pregnancy, urine     Status: None   Collection Time: 07/15/16 10:55 AM  Result Value Ref Range   Preg Test, Ur NEGATIVE NEGATIVE    Blood Alcohol level:  No results found for: [facility]  Metabolic Disorder Labs: No results found for: HGBA1C, MPG No results found for: PROLACTIN No results found for: CHOL, TRIG, HDL, CHOLHDL, VLDL, LDLCALC  Physical Findings: AIMS: Facial and Oral Movements Muscles of Facial Expression: None, normal Lips and Perioral Area: None, normal Jaw: None, normal Tongue: None, normal,Extremity Movements Upper (arms, wrists, hands,  fingers): None, normal Lower (legs, knees, ankles, toes): None, normal, Trunk Movements Neck, shoulders, hips: None, normal, Overall Severity Severity of abnormal movements (highest score from questions above): None, normal Incapacitation due to abnormal movements: None, normal Patient's awareness of abnormal movements (rate only patient's report): No Awareness, Dental Status Current problems with teeth and/or dentures?: Yes Does patient usually wear dentures?: Yes  CIWA:    COWS:     Musculoskeletal: Strength & Muscle Tone: within normal limits Gait & Station: normal Patient leans: N/A  Psychiatric Specialty Exam: Physical Exam  Constitutional: She is oriented to person, place, and time. She appears well-developed and well-nourished.  HENT:  Head: Normocephalic and atraumatic.  Eyes: EOM are normal.  Neck: Normal range of motion.  Respiratory: Effort normal.  Musculoskeletal: Normal range of motion.  Neurological: She is alert and oriented to person, place, and time.    Review of Systems  Constitutional: Negative for fever.  HENT: Positive for congestion.   Respiratory: Positive for cough.   Psychiatric/Behavioral: Positive for depression. Negative for hallucinations and suicidal ideas. The patient is nervous/anxious.   All other systems reviewed and are negative.   Blood pressure 113/77, pulse 84, temperature 98.3 F (36.8 C), temperature source Oral, resp. rate 18, height 5\' 2"  (1.575 m), weight 54.4 kg (120 lb), SpO2 100 %.Body mass index is 21.95 kg/m.  General Appearance: Fairly Groomed  Eye Contact:  Good  Speech:  Clear and Coherent and Normal Rate  Volume:  Normal  Mood:  Anxious  Affect:  Blunt  Thought Process:  Coherent  Orientation:  Full (Time, Place, and Person)  Thought Content:  Logical  Suicidal Thoughts:  No  Homicidal Thoughts:  No  Memory:  Immediate;   Fair Recent;   Fair Remote;   Fair  Judgement:  Fair  Insight:  Shallow  Psychomotor  Activity:  Decreased  Concentration:  Concentration: Fair and Attention Span: Fair  Recall:  [employer]  Fund of Knowledge:  Fair  Language:  Good  Akathisia:  No  Handed:    AIMS (if indicated):     Assets:  Desire for Improvement Social Support  ADL's:  Intact  Cognition:  WNL  Sleep:  Number of Hours: 7     Treatment Plan Summary: Daily contact with patient to assess and evaluate symptoms and progress in treatment and Medication management   Major depressive disorder: Continue fluoxetine 10 mg by mouth daily. Pt has not attended any groups.  PTSD: Stop the prazosin due to patient request and concern that it caused her nightmare. Pt was told it was likely not due to the medication, but that it the medication would be stopped.  For insomnia: continue trazodone 50 mg by mouth daily at bedtime  For hypertension: Continue lisinopril 5 mg PO daily. Monitor BP  Chronic pain patient reports having problems with pinched nerve in her shoulder. Patient stated that she was prescribed with Percocet by her primary care provider. She has agreed with trying Tylenol and ibuprofen while at the hospital.   COPD: Continue spiriva and and albuterol  Tobacco use disorder: continue nicotine patch 21 mg a day  Diet low sodium  Precautions every 15 minute checks  Labs I will order TSH and vitamin B12--pending  Possible discharge in 3-5 days  Jimmy FootmanHernandez-Gonzalez,  Macil Crady, MD 07/16/2016, 2:58 PM

## 2016-07-16 NOTE — [facility] Group Notes (Signed)
[facility] LCSW Group Therapy  07/16/2016 12:42 PM  Type of Therapy:  Group Therapy  Participation Level:  Pt did not attend group. CSW invited pt to group.   Summary of Progress/Problems: Balance in life: Patients will discuss the concept of balance and how it looks and feels to be unbalanced. Pt will identify areas in their life that is unbalanced and ways to become more balanced.    Helen Valdez MSW, LCSWA 07/16/2016, 12:42 PM

## 2016-07-16 NOTE — Progress Notes (Signed)
D: Observed pt in room on bed. Patient alert and oriented x4. Patient denies SI/HI/AVH. Pt affect is anxious. Pt stated his anxiety is 8/10 and depression 2/10. Pt stated her depression is "tremendously better." Pt stated she has no more "negative or negative thoughts or feelings." Pt did have some worries about medication. Pt c/o of mid back pain radiating down to legs A: Offered active listening and support. Provided therapeutic communication. Administered scheduled medications. Educated pt on medication. Gave tylenol prn for pain. R: Pt pleasant and cooperative. Pt medication compliant. Will continue Q15 min. checks. Safety maintained.

## 2016-07-16 NOTE — Progress Notes (Signed)
Refused to get OOB for am medication.  Presented to nurses station at 1200 requesting medication and am medication given at that time.  Passive SI.  Denies AVH/HI.  States that depression is getting better.

## 2016-07-17 MED ORDER — ALBUTEROL SULFATE HFA 108 (90 BASE) MCG/ACT IN AERS: 1.0000 | INHALATION_SPRAY | RESPIRATORY_TRACT | 0 refills | Status: DC | PRN

## 2016-07-17 MED ORDER — HYDROXYZINE HCL 25 MG PO TABS: 25.0000 mg | ORAL_TABLET | Freq: Four times a day (QID) | ORAL | 0 refills | Status: DC | PRN

## 2016-07-17 MED ORDER — LIDOCAINE 5 % EX PTCH
1.0000 | MEDICATED_PATCH | CUTANEOUS | Status: DC
  Administered 2016-07-17 – 2016-07-18 (×2): 1 via TRANSDERMAL
  Filled 2016-07-17 (×2): qty 1

## 2016-07-17 MED ORDER — FLUOXETINE HCL 10 MG PO CAPS: 10.0000 mg | ORAL_CAPSULE | Freq: Every day | ORAL | 0 refills | Status: DC

## 2016-07-17 MED ORDER — LIDOCAINE 5 % EX PTCH
1.0000 | MEDICATED_PATCH | CUTANEOUS | 0 refills | Status: DC
Start: 2016-07-17 — End: 2017-10-22

## 2016-07-17 MED ORDER — LISINOPRIL 5 MG PO TABS: 5.0000 mg | ORAL_TABLET | Freq: Every day | ORAL | 0 refills | Status: DC

## 2016-07-17 MED ORDER — IBUPROFEN 600 MG PO TABS: 600.0000 mg | ORAL_TABLET | Freq: Four times a day (QID) | ORAL | 0 refills | Status: DC | PRN

## 2016-07-17 NOTE — [facility] Group Notes (Signed)
[facility] Group Notes:  (Nursing/MHT/Case Management/Adjunct)  Date:  07/17/2016  Time:  6:11 PM  Type of Therapy:  Psychoeducational Skills  Participation Level:  Active  Participation Quality:  Appropriate  Affect:  Appropriate  Cognitive:  Appropriate  Insight:  Appropriate  Engagement in Group:  Engaged  Modes of Intervention:  Socialization  Summary of Progress/Problems:  Helen Valdez 07/17/2016, 6:11 PM
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
## 2016-07-17 NOTE — Plan of Care (Signed)
Problem: Self-Concept: Goal: Level of anxiety will decrease Outcome: Not Progressing Pt c/o anxiety and requests PRN medication frequently.  Problem: Medication: Goal: Compliance with prescribed medication regimen will improve Outcome: Progressing Pt taking medications as prescribed.
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
## 2016-07-17 NOTE — [facility] Group Notes (Signed)
[facility] Group Notes:  (Nursing/MHT/Case Management/Adjunct)  Date:  07/17/2016  Time:  2:26 AM  Type of Therapy:  Group Therapy  Participation Level:  Minimal  Participation Quality:  Attentive  Affect:  Appropriate  Cognitive:  Alert  Insight:  Appropriate  Engagement in Group:  Limited  Modes of Intervention:  n/a  Summary of Progress/Problems:  Helen Valdez 07/17/2016, 2:26 AM

## 2016-07-17 NOTE — Tx Team (Signed)
Helen Valdez  Pt name [id number]    MDD (major depressive disorder) (HCC)  Principal Problem Principal Problem:   MDD (major depressive disorder) (HCC) Active Problems:   COPD (chronic obstructive pulmonary disease) (HCC)   HTN (hypertension)   Tobacco use disorder   TBI (traumatic brain injury) (HCC)   PTSD (post-traumatic stress disorder)  Secondary Dx/Hospital Problem List Current Facility-Administered Medications  Medication Dose Route Frequency Provider Last Rate Last Dose  . acetaminophen (TYLENOL) tablet 1,000 mg  1,000 mg Oral Q6H PRN Jimmy Footman, MD   1,000 mg at 07/16/16 2131  . albuterol (PROVENTIL HFA;VENTOLIN HFA) 108 (90 Base) MCG/ACT inhaler 1-2 puff  1-2 puff Inhalation Q4H PRN Jimmy Footman, MD      . alum & mag hydroxide-simeth (MAALOX/MYLANTA) 200-200-20 MG/5ML suspension 30 mL  30 mL Oral Q4H PRN Jimmy Footman, MD      . FLUoxetine (PROZAC) capsule 10 mg  10 mg Oral Daily Jimmy Footman, MD   10 mg at 07/17/16 1032  . hydrOXYzine (ATARAX/VISTARIL) tablet 25 mg  25 mg Oral QID PRN Jimmy Footman, MD   25 mg at 07/17/16 1034  . ibuprofen (ADVIL,MOTRIN) tablet 600 mg  600 mg Oral Q6H PRN Jimmy Footman, MD   600 mg at 07/17/16 1034  . lip balm (BLISTEX) ointment   Topical PRN Jimmy Footman, MD      . lisinopril (PRINIVIL,ZESTRIL) tablet 5 mg  5 mg Oral Daily Jimmy Footman, MD   5 mg at 07/17/16 1032  . magnesium hydroxide (MILK OF MAGNESIA) suspension 30 mL  30 mL Oral Daily PRN Jimmy Footman, MD      . nicotine (NICODERM CQ - dosed in mg/24 hours) patch 21 mg  21 mg Transdermal Daily Jimmy Footman, MD   21 mg at 07/16/16 1204  . promethazine (PHENERGAN) tablet 12.5 mg  12.5 mg Oral Q6H PRN Jimmy Footman, MD      . QUEtiapine (SEROQUEL) tablet 50 mg  50 mg Oral QHS Brandy Hale, MD   50 mg at 07/16/16 2130  . tiotropium (SPIRIVA) inhalation  capsule 18 mcg  18 mcg Inhalation Daily Jimmy Footman, MD   18 mcg at 07/17/16 1032     Current Meds Prescriptions Prior to Admission  Medication Sig Dispense Refill Last Dose  . clonazePAM (KLONOPIN) 1 MG tablet Take 1 mg by mouth 3 (three) times daily as needed for anxiety.   07/14/2016 at Unknown time  . gabapentin (NEURONTIN) 300 MG capsule Take 300 mg by mouth.   07/14/2016 at Unknown time  . lisinopril-hydrochlorothiazide (PRINZIDE,ZESTORETIC) 20-12.5 MG tablet Take 1 tablet by mouth daily.   07/14/2016 at Unknown time  . oxyCODONE-acetaminophen (PERCOCET) 10-325 MG tablet Take 1 tablet by mouth 3 (three) times daily as needed for pain.   07/14/2016 at Unknown time  . QUEtiapine (SEROQUEL) 50 MG tablet Take 50 mg by mouth 2 (two) times daily.   07/14/2016 at Unknown time  . tiotropium (SPIRIVA) 18 MCG inhalation capsule Place 18 mcg into inhaler and inhale daily.   07/14/2016 at Unknown time     Prior to Admission Meds   Interdisciplinary Treatment and Diagnostic Plan Update  07/17/2016 Time of Session: 11:42 AM  Helen Valdez MRN: [id number]  Principal Diagnosis: MDD (major depressive disorder) (HCC)  Secondary Diagnoses: Principal Problem:   MDD (major depressive disorder) (HCC) Active Problems:   COPD (chronic obstructive pulmonary disease) (HCC)   HTN (hypertension)   Tobacco use disorder   TBI (traumatic brain injury) (HCC)  PTSD (post-traumatic stress disorder)   Current Medications:  Current Facility-Administered Medications  Medication Dose Route Frequency Provider Last Rate Last Dose  . acetaminophen (TYLENOL) tablet 1,000 mg  1,000 mg Oral Q6H PRN Jimmy Footman, MD   1,000 mg at 07/16/16 2131  . albuterol (PROVENTIL HFA;VENTOLIN HFA) 108 (90 Base) MCG/ACT inhaler 1-2 puff  1-2 puff Inhalation Q4H PRN Jimmy Footman, MD      . alum & mag hydroxide-simeth (MAALOX/MYLANTA) 200-200-20 MG/5ML suspension 30 mL  30 mL Oral Q4H PRN Jimmy Footman, MD      . FLUoxetine (PROZAC) capsule 10 mg  10 mg Oral Daily Jimmy Footman, MD   10 mg at 07/17/16 1032  . hydrOXYzine (ATARAX/VISTARIL) tablet 25 mg  25 mg Oral QID PRN Jimmy Footman, MD   25 mg at 07/17/16 1034  . ibuprofen (ADVIL,MOTRIN) tablet 600 mg  600 mg Oral Q6H PRN Jimmy Footman, MD   600 mg at 07/17/16 1034  . lip balm (BLISTEX) ointment   Topical PRN Jimmy Footman, MD      . lisinopril (PRINIVIL,ZESTRIL) tablet 5 mg  5 mg Oral Daily Jimmy Footman, MD   5 mg at 07/17/16 1032  . magnesium hydroxide (MILK OF MAGNESIA) suspension 30 mL  30 mL Oral Daily PRN Jimmy Footman, MD      . nicotine (NICODERM CQ - dosed in mg/24 hours) patch 21 mg  21 mg Transdermal Daily Jimmy Footman, MD   21 mg at 07/16/16 1204  . promethazine (PHENERGAN) tablet 12.5 mg  12.5 mg Oral Q6H PRN Jimmy Footman, MD      . QUEtiapine (SEROQUEL) tablet 50 mg  50 mg Oral QHS Brandy Hale, MD   50 mg at 07/16/16 2130  . tiotropium (SPIRIVA) inhalation capsule 18 mcg  18 mcg Inhalation Daily Jimmy Footman, MD   18 mcg at 07/17/16 1032    PTA Medications: Prescriptions Prior to Admission  Medication Sig Dispense Refill Last Dose  . clonazePAM (KLONOPIN) 1 MG tablet Take 1 mg by mouth 3 (three) times daily as needed for anxiety.   07/14/2016 at Unknown time  . gabapentin (NEURONTIN) 300 MG capsule Take 300 mg by mouth.   07/14/2016 at Unknown time  . lisinopril-hydrochlorothiazide (PRINZIDE,ZESTORETIC) 20-12.5 MG tablet Take 1 tablet by mouth daily.   07/14/2016 at Unknown time  . oxyCODONE-acetaminophen (PERCOCET) 10-325 MG tablet Take 1 tablet by mouth 3 (three) times daily as needed for pain.   07/14/2016 at Unknown time  . QUEtiapine (SEROQUEL) 50 MG tablet Take 50 mg by mouth 2 (two) times daily.   07/14/2016 at Unknown time  . tiotropium (SPIRIVA) 18 MCG inhalation capsule Place 18 mcg into  inhaler and inhale daily.   07/14/2016 at Unknown time    Treatment Modalities: Medication Management, Group therapy, Case management,  1 to 1 session with clinician, Psychoeducation, Recreational therapy.   Physician Treatment Plan for Primary Diagnosis: MDD (major depressive disorder) (HCC) Long Term Goal(s): Improvement in symptoms so as ready for discharge  Short Term Goals: Ability to verbalize feelings will improve and Ability to identify and develop effective coping behaviors will improve  Medication Management: Evaluate patient's response, side effects, and tolerance of medication regimen.  Therapeutic Interventions: 1 to 1 sessions, Unit Group sessions and Medication administration.  Evaluation of Outcomes: Progressing  Physician Treatment Plan for Secondary Diagnosis: Principal Problem:   MDD (major depressive disorder) (HCC) Active Problems:   COPD (chronic obstructive pulmonary disease) (HCC)   HTN (hypertension)   Tobacco  use disorder   TBI (traumatic brain injury) (HCC)   PTSD (post-traumatic stress disorder)   Long Term Goal(s): Improvement in symptoms so as ready for discharge  Short Term Goals: Ability to identify changes in lifestyle to reduce recurrence of condition will improve and Ability to identify and develop effective coping behaviors will improve  Medication Management: Evaluate patient's response, side effects, and tolerance of medication regimen.  Therapeutic Interventions: 1 to 1 sessions, Unit Group sessions and Medication administration.  Evaluation of Outcomes: Progressing   RN Treatment Plan for Primary Diagnosis: MDD (major depressive disorder) (HCC) Long Term Goal(s): Knowledge of disease and therapeutic regimen to maintain health will improve  Short Term Goals: Ability to verbalize frustration and anger appropriately will improve and Ability to participate in decision making will improve  Medication Management: RN will administer  medications as ordered by provider, will assess and evaluate patient's response and provide education to patient for prescribed medication. RN will report any adverse and/or side effects to prescribing provider.  Therapeutic Interventions: 1 on 1 counseling sessions, Psychoeducation, Medication administration, Evaluate responses to treatment, Monitor vital signs and CBGs as ordered, Perform/monitor CIWA, COWS, AIMS and Fall Risk screenings as ordered, Perform wound care treatments as ordered.  Evaluation of Outcomes: Progressing   LCSW Treatment Plan for Primary Diagnosis: MDD (major depressive disorder) (HCC) Long Term Goal(s): Safe transition to appropriate next level of care at discharge, Engage patient in therapeutic group addressing interpersonal concerns.  Short Term Goals: Engage patient in aftercare planning with referrals and resources, Increase social support and Facilitate acceptance of mental health diagnosis and concerns  Therapeutic Interventions: Assess for all discharge needs, 1 to 1 time with Social worker, Explore available resources and support systems, Assess for adequacy in community support network, Educate family and significant other(s) on suicide prevention, Complete Psychosocial Assessment, Interpersonal group therapy.  Evaluation of Outcomes: Progressing   Progress in Treatment: Attending groups: Yes Participating in groups: Yes Taking medication as prescribed: Yes, MD continues to assess for medication changes as needed Toleration medication: Yes, no side effects reported at this time Family/Significant other contact made:  Patient understands diagnosis:  Discussing patient identified problems/goals with staff: Yes Medical problems stabilized or resolved: Yes Denies suicidal/homicidal ideation:  Issues/concerns per patient self-inventory: None Other: N/A  New problem(s) identified: None identified at this time.   New Short Term/Long Term Goal(s): None  identified at this time.   Discharge Plan or Barriers:   Reason for Continuation of Hospitalization: Anxiety Delusions  Depression Hallucinations Homicidal ideation [place] Medical Issues Medication stabilization Suicidal ideation Withdrawal symptoms  Estimated Length of Stay: 3-5 days  Attendees: Patient: Rejeana BrockVickie Kwan  07/17/2016  11:42 AM  Physician: Radene JourneyAndrea Hernandez 07/17/2016  11:42 AM  Nursing: Shelia MediaJanet Jones  07/17/2016  11:42 AM  RN Care Manager: 07/17/2016  11:42 AM  Social Worker: Hampton AbbotKadijah Tilton Marsalis  07/17/2016  11:42 AM  Recreational Therapist:  07/17/2016  11:42 AM  Other: PA student, Sophia  07/17/2016  11:42 AM  Other:  07/17/2016  11:42 AM  Other: 07/17/2016  11:42 AM    Scribe for Treatment Team: Lynden OxfordKadijah R. Patsye Sullivant, MSW, LCSW-A

## 2016-07-17 NOTE — [facility] Group Notes (Deleted)
[facility] LCSW Group Therapy  07/17/2016 2:08 PM  Type of Therapy:  Group Therapy  Participation Level:  None  Participation Quality:  Drowsy  Affect:  Lethargic  Cognitive:  Disorganized  Insight:  Limited  Engagement in Therapy:  None  Modes of Intervention:  Activity, Discussion, Education and Support  Summary of Progress/Problems:Feelings around Relapse. Group members discussed the meaning of relapse and shared personal stories of relapse, how it affected them and others, and how they perceived themselves during this time. Group members were encouraged to identify triggers, warning signs and coping skills used when facing the possibility of relapse. Social supports were discussed and explored in detail. Patients also discussed facing disappointment and how that can trigger someone to relapse. Patient attended group on this date but did not participate due to patient falling asleep.    Helen Valdez G. Garnette [name] MSW, LCSWA 07/17/2016, 2:09 PM

## 2016-07-17 NOTE — [facility] Group Notes (Signed)
[facility] LCSW Group Therapy  07/17/2016 2:10 PM  Type of Therapy:  Group Therapy  Participation Level:  Patient did not attend group. CSW invited patient to group.   Summary of Progress/Problems:Feelings around Relapse. Group members discussed the meaning of relapse and shared personal stories of relapse, how it affected them and others, and how they perceived themselves during this time. Group members were encouraged to identify triggers, warning signs and coping skills used when facing the possibility of relapse. Social supports were discussed and explored in detail. Patients also discussed facing disappointment and how that can trigger someone to relapse.   Helen Valdez G. Garnette [name] MSW, LCSWA 07/17/2016, 2:11 PM
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
## 2016-07-17 NOTE — Progress Notes (Signed)
Patient stated that she could not sleep last night.Stayed in bed most of the morning.Denies suicidal or homicidal ideations & AV hallucinations.Visible in the milieu.Appropriate with staff & peers.Compliant with medications.Encouragement & support given.

## 2016-07-18 DIAGNOSIS — Z5181 Encounter for therapeutic drug level monitoring: Secondary | ICD-10-CM | POA: Diagnosis not present
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
## 2016-07-18 NOTE — Progress Notes (Signed)
D: Pt denies SI/HI/AVH. Pt is pleasant and cooperative. Pt appears less anxious and he is interacting with peers and staff appropriately.  A: Pt was offered support and encouragement. Pt was given scheduled medications. Pt was encouraged to attend groups. Q 15 minute checks were done for safety.  R:Pt attends groups and interacts well with peers and staff. Pt is taking medication. Pt has no complaints.Pt receptive to treatment and safety maintained on unit.   

## 2016-07-18 NOTE — [facility] Group Notes (Signed)
[facility] Group Notes:  (Nursing/MHT/Case Management/Adjunct)  Date:  07/18/2016  Time:  5:44 AM  Type of Therapy:  Psychoeducational Skills  Participation Level:  Active  Participation Quality:  Appropriate, Attentive, Sharing and Supportive  Affect:  Appropriate  Cognitive:  Appropriate  Insight:  Appropriate and Good  Engagement in Group:  Engaged  Modes of Intervention:  Discussion, Socialization and Support  Summary of Progress/Problems:  Chancy MilroyLaquanda Y Tynisha Ogan 07/18/2016, 5:44 AM

## 2016-07-18 NOTE — Progress Notes (Signed)
Patient denies SI/HI, denies A/V hallucinations. Patient verbalizes understanding of discharge instructions, follow up care and prescriptions. Patient given all belongings from  locker. Patient escorted out by staff, transported by family. 

## 2016-07-18 NOTE — Progress Notes (Signed)
  [facility] :  Will you be returning to the same living situation after discharge:  Yes,  Patient is returning home. At discharge, do you have transportation home?: Yes,  fiance' and daughter. Do you have the ability to pay for your medications: Yes,  Patient has Medicaid.  Release of information consent forms completed and in the chart;  Patient's signature needed at discharge.  Patient to Follow up at: Follow-up Information    [facility]. Go on 07/21/2016.   Why:  Please arrive to your hospital follow-up appointment Tuesday, Sept. 5th at 1:45PM. It is important to arrive to this appointment 15 minutes early for prompt services. Take your discharge packet to this appointment. Contact information: [address] [place], [place] [address] Phone: [phone] Fax: [phone]          Next level of care provider has access to [facility] Link:no  Safety Planning and Suicide Prevention discussed: Yes,  Patient refused for family/support person to be contact. CSW discussed safety planning and suicide prevention with patient.   Have you used any form of tobacco in the last 30 days? (Cigarettes, Smokeless Tobacco, Cigars, and/or Pipes): Yes  Has patient been referred to the Quitline?: Patient refused referral  Patient has been referred for addiction treatment: Yes  Keegan Bensch G. Garnette [name] MSW, LCSWA 07/18/2016, 10:39 AM

## 2016-07-18 NOTE — Tx Team (Signed)
Interdisciplinary Treatment and Diagnostic Plan Update  07/18/2016 Time of Session: 10:15 [name] MRN: [id number]  Principal Diagnosis: MDD (major depressive disorder) (HCC)  Secondary Diagnoses: Principal Problem:   MDD (major depressive disorder) (HCC) Active Problems:   COPD (chronic obstructive pulmonary disease) (HCC)   HTN (hypertension)   Tobacco use disorder   TBI (traumatic brain injury) (HCC)   PTSD (post-traumatic stress disorder)   Current Medications:  Current Facility-Administered Medications  Medication Dose Route Frequency Provider Last Rate Last Dose  . acetaminophen (TYLENOL) tablet 1,000 mg  1,000 mg Oral Q6H PRN Jimmy Footman, MD   1,000 mg at 07/16/16 2131  . albuterol (PROVENTIL HFA;VENTOLIN HFA) 108 (90 Base) MCG/ACT inhaler 1-2 puff  1-2 puff Inhalation Q4H PRN Jimmy Footman, MD      . alum & mag hydroxide-simeth (MAALOX/MYLANTA) 200-200-20 MG/5ML suspension 30 mL  30 mL Oral Q4H PRN Jimmy Footman, MD      . FLUoxetine (PROZAC) capsule 10 mg  10 mg Oral Daily Jimmy Footman, MD   10 mg at 07/17/16 1032  . hydrOXYzine (ATARAX/VISTARIL) tablet 25 mg  25 mg Oral QID PRN Jimmy Footman, MD   25 mg at 07/17/16 2147  . ibuprofen (ADVIL,MOTRIN) tablet 600 mg  600 mg Oral Q6H PRN Jimmy Footman, MD   600 mg at 07/17/16 2146  . lidocaine (LIDODERM) 5 % 1 patch  1 patch Transdermal Q24H Jimmy Footman, MD   1 patch at 07/17/16 1254  . lip balm (BLISTEX) ointment   Topical PRN Jimmy Footman, MD      . lisinopril (PRINIVIL,ZESTRIL) tablet 5 mg  5 mg Oral Daily Jimmy Footman, MD   5 mg at 07/17/16 1032  . magnesium hydroxide (MILK OF MAGNESIA) suspension 30 mL  30 mL Oral Daily PRN Jimmy Footman, MD      . nicotine (NICODERM CQ - dosed in mg/24 hours) patch 21 mg  21 mg Transdermal Daily Jimmy Footman, MD   21 mg at 07/16/16 1204  .  promethazine (PHENERGAN) tablet 12.5 mg  12.5 mg Oral Q6H PRN Jimmy Footman, MD      . QUEtiapine (SEROQUEL) tablet 50 mg  50 mg Oral QHS Brandy Hale, MD   50 mg at 07/17/16 2147  . tiotropium (SPIRIVA) inhalation capsule 18 mcg  18 mcg Inhalation Daily Jimmy Footman, MD   18 mcg at 07/17/16 1032   PTA Medications: Prescriptions Prior to Admission  Medication Sig Dispense Refill Last Dose  . clonazePAM (KLONOPIN) 1 MG tablet Take 1 mg by mouth 3 (three) times daily as needed for anxiety.   07/14/2016 at Unknown time  . gabapentin (NEURONTIN) 300 MG capsule Take 300 mg by mouth.   07/14/2016 at Unknown time  . lisinopril-hydrochlorothiazide (PRINZIDE,ZESTORETIC) 20-12.5 MG tablet Take 1 tablet by mouth daily.   07/14/2016 at Unknown time  . oxyCODONE-acetaminophen (PERCOCET) 10-325 MG tablet Take 1 tablet by mouth 3 (three) times daily as needed for pain.   07/14/2016 at Unknown time  . QUEtiapine (SEROQUEL) 50 MG tablet Take 50 mg by mouth 2 (two) times daily.   07/14/2016 at Unknown time  . tiotropium (SPIRIVA) 18 MCG inhalation capsule Place 18 mcg into inhaler and inhale daily.   07/14/2016 at Unknown time    Treatment Modalities: Medication Management, Group therapy, Case management,  1 to 1 session with clinician, Psychoeducation, Recreational therapy.   Physician Treatment Plan for Primary Diagnosis: MDD (major depressive disorder) (HCC) Long Term Goal(s): Improvement in symptoms so as ready for  discharge   Short Term Goals: Ability to identify changes in lifestyle to reduce recurrence of condition will improve and Ability to verbalize feelings will improve  Medication Management: Evaluate patient's response, side effects, and tolerance of medication regimen.  Therapeutic Interventions: 1 to 1 sessions, Unit Group sessions and Medication administration.  Evaluation of Outcomes: Adequate for Discharge  Physician Treatment Plan for Secondary Diagnosis: Principal  Problem:   MDD (major depressive disorder) (HCC) Active Problems:   COPD (chronic obstructive pulmonary disease) (HCC)   HTN (hypertension)   Tobacco use disorder   TBI (traumatic brain injury) (HCC)   PTSD (post-traumatic stress disorder)  Long Term Goal(s): Improvement in symptoms so as ready for discharge  Short Term Goals: Ability to identify changes in lifestyle to reduce recurrence of condition will improve and Ability to identify and develop effective coping behaviors will improve  Medication Management: Evaluate patient's response, side effects, and tolerance of medication regimen.  Therapeutic Interventions: 1 to 1 sessions, Unit Group sessions and Medication administration.  Evaluation of Outcomes: Adequate for Discharge   RN Treatment Plan for Primary Diagnosis: MDD (major depressive disorder) (HCC) Long Term Goal(s): Knowledge of disease and therapeutic regimen to maintain health will improve  Short Term Goals: Ability to verbalize frustration and anger appropriately will improve and Ability to participate in decision making will improve  Medication Management: RN will administer medications as ordered by provider, will assess and evaluate patient's response and provide education to patient for prescribed medication. RN will report any adverse and/or side effects to prescribing provider.  Therapeutic Interventions: 1 on 1 counseling sessions, Psychoeducation, Medication administration, Evaluate responses to treatment, Monitor vital signs and CBGs as ordered, Perform/monitor CIWA, COWS, AIMS and Fall Risk screenings as ordered, Perform wound care treatments as ordered.  Evaluation of Outcomes: Adequate for Discharge   LCSW Treatment Plan for Primary Diagnosis: MDD (major depressive disorder) (HCC) Long Term Goal(s): Safe transition to appropriate next level of care at discharge, Engage patient in therapeutic group addressing interpersonal concerns.  Short Term Goals:  Engage patient in aftercare planning with referrals and resources, Increase social support and Facilitate acceptance of mental health diagnosis and concerns  Therapeutic Interventions: Assess for all discharge needs, 1 to 1 time with Social worker, Explore available resources and support systems, Assess for adequacy in community support network, Educate family and significant other(s) on suicide prevention, Complete Psychosocial Assessment, Interpersonal group therapy.  Evaluation of Outcomes: Adequate for Discharge   Progress in Treatment: Attending groups: Yes. Participating in groups: Yes. Taking medication as prescribed: Yes. Toleration medication: Yes. Family/Significant other contact made: No, Patient declined for family to be contacted by staff.  Patient understands diagnosis: Yes. Discussing patient identified problems/goals with staff: Yes. Medical problems stabilized or resolved: Yes. Denies suicidal/homicidal ideation: Yes. Issues/concerns per patient self-inventory: No. Other: n/a  New problem(s) identified: None identified at this time.   New Short Term/Long Term Goal(s): None identified at this time.  Discharge Plan or Barriers: Patient discharging 07/18/2016, Patient has scheduled follow-up appointment on 07/21/2016 at 1:45pm with [facility] for outpatient services.   Reason for Continuation of Hospitalization: Other; describe Patient is dischargin 07/18/2016  Estimated Length of Stay: Discharging 07/18/2016  Attendees: Patient: Helen Valdez 07/18/2016 9:56 AM  Physician: Dr. Radene JourneyJayme Cloud, MD 07/18/2016 9:56 AM  Nursing:  07/18/2016 9:56 AM  RN Care Manager: Maryagnes Amos. Jimmey Ralph, RN 07/18/2016 9:56 AM  Social Worker: Fredrich Birks. Garnette Czech MSW, LCSWA 07/18/2016 9:56 AM  Recreational Therapist:  07/18/2016  9:56 AM  Other:  07/18/2016 9:56 AM  Other:  07/18/2016 9:56 AM  Other: 07/18/2016 9:56 AM    Scribe for Treatment Team: Fredrich BirksAmaris G. Garnette [name] MSW,  [facility]CSWA 07/18/2016 10:36 AM

## 2017-10-19 ENCOUNTER — Inpatient Hospital Stay (HOSPITAL_COMMUNITY)
Admission: AD | Admit: 2017-10-19 | Discharge: 2017-10-22 | DRG: 885 | Disposition: A | Discharge status: Home / Self Care | Payer: Medicaid Other | Source: Intra-hospital | Attending: Psychiatry | Admitting: Psychiatry

## 2017-10-19 ENCOUNTER — Encounter (HOSPITAL_COMMUNITY): Payer: Self-pay

## 2017-10-19 ENCOUNTER — Other Ambulatory Visit: Payer: Self-pay

## 2017-10-19 DIAGNOSIS — F1994 Other psychoactive substance use, unspecified with psychoactive substance-induced mood disorder: Secondary | ICD-10-CM | POA: Diagnosis not present

## 2017-10-19 DIAGNOSIS — R451 Restlessness and agitation: Secondary | ICD-10-CM | POA: Diagnosis not present

## 2017-10-19 DIAGNOSIS — F39 Unspecified mood [affective] disorder: Secondary | ICD-10-CM | POA: Diagnosis not present

## 2017-10-19 DIAGNOSIS — F1410 Cocaine abuse, uncomplicated: Secondary | ICD-10-CM | POA: Diagnosis present

## 2017-10-19 DIAGNOSIS — F17210 Nicotine dependence, cigarettes, uncomplicated: Secondary | ICD-10-CM | POA: Diagnosis not present

## 2017-10-19 DIAGNOSIS — J449 Chronic obstructive pulmonary disease, unspecified: Secondary | ICD-10-CM | POA: Diagnosis present

## 2017-10-19 DIAGNOSIS — R45851 Suicidal ideations: Secondary | ICD-10-CM | POA: Diagnosis present

## 2017-10-19 DIAGNOSIS — Z818 Family history of other mental and behavioral disorders: Secondary | ICD-10-CM | POA: Diagnosis not present

## 2017-10-19 DIAGNOSIS — F4310 Post-traumatic stress disorder, unspecified: Secondary | ICD-10-CM | POA: Diagnosis present

## 2017-10-19 DIAGNOSIS — F339 Major depressive disorder, recurrent, unspecified: Secondary | ICD-10-CM | POA: Diagnosis present

## 2017-10-19 DIAGNOSIS — I10 Essential (primary) hypertension: Secondary | ICD-10-CM | POA: Diagnosis present

## 2017-10-19 DIAGNOSIS — Z79899 Other long term (current) drug therapy: Secondary | ICD-10-CM

## 2017-10-19 DIAGNOSIS — Z736 Limitation of activities due to disability: Secondary | ICD-10-CM | POA: Diagnosis not present

## 2017-10-19 DIAGNOSIS — F332 Major depressive disorder, recurrent severe without psychotic features: Secondary | ICD-10-CM | POA: Diagnosis not present

## 2017-10-19 DIAGNOSIS — F329 Major depressive disorder, single episode, unspecified: Secondary | ICD-10-CM | POA: Diagnosis present

## 2017-10-19 MED ORDER — LISINOPRIL 5 MG PO TABS
5.0000 mg | ORAL_TABLET | Freq: Every day | ORAL | Status: DC
  Administered 2017-10-19 – 2017-10-22 (×4): 5 mg via ORAL
  Filled 2017-10-19 (×7): qty 1

## 2017-10-19 MED ORDER — HYDROXYZINE HCL 25 MG PO TABS
25.0000 mg | ORAL_TABLET | Freq: Three times a day (TID) | ORAL | Status: DC | PRN
  Administered 2017-10-19 (×2): 25 mg via ORAL
  Filled 2017-10-19 (×2): qty 1

## 2017-10-19 MED ORDER — MAGNESIUM HYDROXIDE 400 MG/5ML PO SUSP: 30.0000 mL | Freq: Every day | ORAL | Status: DC | PRN

## 2017-10-19 MED ORDER — FLUOXETINE HCL 10 MG PO CAPS
10.0000 mg | ORAL_CAPSULE | Freq: Every day | ORAL | Status: DC
  Administered 2017-10-19: 10 mg via ORAL
  Filled 2017-10-19 (×3): qty 1

## 2017-10-19 MED ORDER — NICOTINE 21 MG/24HR TD PT24
21.0000 mg | MEDICATED_PATCH | Freq: Every day | TRANSDERMAL | Status: DC
  Administered 2017-10-19 – 2017-10-21 (×3): 21 mg via TRANSDERMAL
  Filled 2017-10-19 (×6): qty 1

## 2017-10-19 MED ORDER — ACETAMINOPHEN 325 MG PO TABS
650.0000 mg | ORAL_TABLET | Freq: Four times a day (QID) | ORAL | Status: DC | PRN
  Administered 2017-10-19 – 2017-10-22 (×5): 650 mg via ORAL
  Filled 2017-10-19 (×5): qty 2

## 2017-10-19 MED ORDER — ALUM & MAG HYDROXIDE-SIMETH 200-200-20 MG/5ML PO SUSP: 30.0000 mL | ORAL | Status: DC | PRN

## 2017-10-19 MED ORDER — TRAZODONE HCL 50 MG PO TABS
50.0000 mg | ORAL_TABLET | Freq: Every evening | ORAL | Status: DC | PRN
  Administered 2017-10-19: 50 mg via ORAL
  Filled 2017-10-19: qty 1

## 2017-10-19 MED ORDER — QUETIAPINE FUMARATE 100 MG PO TABS
ORAL_TABLET | ORAL | Status: AC
  Filled 2017-10-19: qty 1

## 2017-10-19 MED ORDER — QUETIAPINE FUMARATE 100 MG PO TABS
100.0000 mg | ORAL_TABLET | Freq: Every evening | ORAL | Status: DC | PRN
  Administered 2017-10-19: 100 mg via ORAL
  Filled 2017-10-19 (×3): qty 1

## 2017-10-19 NOTE — Progress Notes (Signed)
Adult Psychoeducational Group Note  Date:  10/19/2017 Time:  11:14 PM  Group Topic/Focus:  Wrap-Up Group:   The focus of this group is to help patients review their daily goal of treatment and discuss progress on daily workbooks.  Participation Level:  Minimal  Participation Quality:  Appropriate  Affect:  Depressed  Cognitive:  Appropriate  Insight: Limited  Engagement in Group:  Limited  Modes of Intervention:  Discussion  Additional Comments:  Patient stated that her goal was to attend all of the groups today. Patient stated that she did attend todays groups, but it took a lot of energy. Patient wants to have the same goal tomorrow. Patient long term goal is to get everything out that is bothering her.   Lyndee HensenGoins, Sircharles Holzheimer R 10/19/2017, 11:14 PM

## 2017-10-19 NOTE — Progress Notes (Signed)
Recreation Therapy Notes  Animal-Assisted Activity (AAA) Program Checklist/Progress Notes Patient Eligibility Criteria Checklist & Daily Group note for Rec TxIntervention  Date: 12.04.2018 Time: 2:45pm Location: [address]   AAA/T Program Assumption of Risk Form signed by Patient/ or Parent Legal Guardian Yes  Patient is free of allergies or sever asthma Yes  Patient reports no fear of animals Yes  Patient reports no history of cruelty to animals Yes  Patient understands his/her participation is voluntary Yes  Behavioral Response: Did not attend.   Marykay Lexenise L Kellon Chalk, LRT/CTRS      Deyon Chizek L 10/19/2017 2:59 PM
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
## 2017-10-19 NOTE — Tx Team (Signed)
Initial Treatment Plan 10/19/2017 1:29 PM Helen Valdez ZOX:[id number]    PATIENT STRESSORS: Financial difficulties Marital or family conflict Medication change or noncompliance Substance abuse   PATIENT STRENGTHS: General fund of knowledge Physical Health   PATIENT IDENTIFIED PROBLEMS: Depression  Suicidal ideation  "Get peace of mind"  "Get some sleep is all I want"               DISCHARGE CRITERIA:  Improved stabilization in mood, thinking, and/or behavior Verbal commitment to aftercare and medication compliance  PRELIMINARY DISCHARGE PLAN: Outpatient therapy Medication management  PATIENT/FAMILY INVOLVEMENT: This treatment plan has been presented to and reviewed with the patient, Helen Valdez.  The patient and family have been given the opportunity to ask questions and make suggestions.  [name], RN 10/19/2017, 1:29 PM

## 2017-10-19 NOTE — Progress Notes (Signed)
D   Pt having a difficult time falling asleep this evening    She said she takes 100 mg of seroquel every night but it wasn't ordered for her     She endorses depression and anxiety    Her behavior is appropriate A   Verbal support given   Medications administered and effectiveness monitored  Reported pt concerns regarding her medication to the PA who ordered medication for her R  Pt is safe at present time

## 2017-10-19 NOTE — Progress Notes (Signed)
Helen Valdez is a 46 year old female being admitted voluntarily to [address] from [facility]. She came in with suicidal ideation and reportedly held a gun to her head for 7 hours straight.  She denies HI or A/V hallucinations.  She reportedly used cocaine over the weekend to feel better.  She is diagnosed with Major Depressive Disorder.  She has medical history of hypertension.  During [facility] admission, she was irritable.  Didn't want to answer questions.  She kept saying "I don't want to talk about that right now."  She continues to voice suicidal ideation and will contract for safety on the unit.  She was unable to report what current stressors that she is having but just said "I'm tired of everything."  After talking during admission, she stated that she has to take care of all her family and doesn't have anything left to help herself.  "I run out of money because of them and I have nothing left to by my needs (deodrant etc)."  Speech was slurred and hard to understand.  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  Oriented to the unit.  Admission paperwork completed and signed.  Belongings searched and secured in locker # 49.  Skin assessment completed and no skin issues noted.  Q 15 minute checks initiated for safety.  We will monitor the progress towards her goals.

## 2017-10-20 DIAGNOSIS — F1994 Other psychoactive substance use, unspecified with psychoactive substance-induced mood disorder: Secondary | ICD-10-CM

## 2017-10-20 DIAGNOSIS — F17210 Nicotine dependence, cigarettes, uncomplicated: Secondary | ICD-10-CM

## 2017-10-20 DIAGNOSIS — J449 Chronic obstructive pulmonary disease, unspecified: Secondary | ICD-10-CM

## 2017-10-20 DIAGNOSIS — F332 Major depressive disorder, recurrent severe without psychotic features: Secondary | ICD-10-CM

## 2017-10-20 DIAGNOSIS — Z818 Family history of other mental and behavioral disorders: Secondary | ICD-10-CM

## 2017-10-20 DIAGNOSIS — I10 Essential (primary) hypertension: Secondary | ICD-10-CM

## 2017-10-20 DIAGNOSIS — Z736 Limitation of activities due to disability: Secondary | ICD-10-CM

## 2017-10-20 MED ORDER — MIRTAZAPINE 15 MG PO TABS
15.0000 mg | ORAL_TABLET | Freq: Every day | ORAL | Status: DC
  Administered 2017-10-20: 15 mg via ORAL
  Filled 2017-10-20 (×3): qty 1

## 2017-10-20 MED ORDER — SERTRALINE HCL 25 MG PO TABS
25.0000 mg | ORAL_TABLET | Freq: Every day | ORAL | Status: DC
  Administered 2017-10-20 – 2017-10-21 (×2): 25 mg via ORAL
  Filled 2017-10-20 (×5): qty 1

## 2017-10-20 MED ORDER — GABAPENTIN 300 MG PO CAPS
300.0000 mg | ORAL_CAPSULE | Freq: Three times a day (TID) | ORAL | Status: DC
  Administered 2017-10-20 – 2017-10-22 (×9): 300 mg via ORAL
  Filled 2017-10-20 (×13): qty 1

## 2017-10-20 MED ORDER — QUETIAPINE FUMARATE 100 MG PO TABS
100.0000 mg | ORAL_TABLET | Freq: Every evening | ORAL | Status: DC | PRN
  Administered 2017-10-20: 100 mg via ORAL
  Filled 2017-10-20: qty 1

## 2017-10-20 MED ORDER — ARIPIPRAZOLE 2 MG PO TABS
2.0000 mg | ORAL_TABLET | Freq: Every day | ORAL | Status: DC
  Filled 2017-10-20 (×2): qty 1

## 2017-10-20 NOTE — Progress Notes (Signed)
Data. Patient denies SI/HI/AVH. Verbally contracts for safety on the unit and to come to staff before acting of any self harm thoughts/feelings.  Patient interacting well with staff and other patients. Late in the afternoon patient reported pain in her lower abdomen, on both sides. "My boyfriend is [place]. I think I might have an STD." MD notified via chart.  Action. Emotional support and encouragement offered. Education provided on medication, indications and side effect. Q 15 minute checks done for safety. Response. Safety on the unit maintained through 15 minute checks.  Medications taken as prescribed. Remained calm and appropriate through out shift.

## 2017-10-20 NOTE — H&P (Addendum)
Psychiatric Admission Assessment Adult  Patient Identification: Helen Valdez MRN:  [id number] Date of Evaluation:  10/20/2017 Chief Complaint:  Suicidal behavior Principal Diagnosis: MDD Diagnosis:   Patient Active Problem List   Diagnosis Date Noted  . TBI (traumatic brain injury) (HCC) [S06.9X9A] 07/15/2016  . PTSD (post-traumatic stress disorder) [F43.10] 07/15/2016  . MDD (major depressive disorder) [F32.9] 07/14/2016  . COPD (chronic obstructive pulmonary disease) (HCC) [J44.9] 07/14/2016  . HTN (hypertension) [I10] 07/14/2016  . Tobacco use disorder [F17.200] 07/14/2016   History of Present Illness:  46 y.o Caucasian female, single, lives with her daughter, on disablity. Background history of MDD, SUD, TBI. Presented to the [facility] ER in company of her daughter. She has been feeling very depressed lately. She has been using cocaine to cope. She has been off her medications for about a month. Patient held a gun to own head for seven hours. She was very distraught and tearful at the ER. Main stressors is her family dynamics.  Routine labs are essentially normal, toxicology is negative,  UDS is positive for cocaine and opiates.  At interview, patient reports worsening depression since she got out of prison. She was incarcerated from December 2016 to November 2017. Says when she got out of prison, she found out that her partner has been having an affair. Says they have been together for seventeen years. Says they have minor children together. Patient says by January of this year, he asked her to leave his house. Patient says she got an apartment of her own for a while but later moved in with one of her grown daughters. Patient says she has been very depressed since then  " I have not been myself ,,,,, this is a huge change for me ,,, I have always been used to being a mom ,,,, I have not seen my minor kids since April last year ,,,,, things aren't right at my daughter's place ,,,, there is a  lot of fighting and bickering ,,,, I do not get int bed early ,,,, no peace there ,,,,, so much on my plate ,,,, I miss my kids" . Patient says the final straw was last Monday, she found out that her kids father has taken the kids out of state. Says he told her she would never see them again. Patient says she has been feeling suicidal since then. She feels as if there is nothing to live for anymore. Says she is not eating, she has lost four pounds. She has been crying a lot. Says she has been feeling hopeless and worthless. She felt like shooting herself. Says she knows it is not the right thing. A part of her held back. Patient says she wants to get better. No current suicidal thoughts. No homicidal thoughts. No thoughts of violence. She had just one gun in the house. Says it has been secured. No evidence of psychosis. No evidence of mania. No pending legal issues. Says she is on probation for three years.    Total Time spent with patient: 1 hour  Past Psychiatric History: Patient was admitted once in 2017. She was depressed and suicidal at that time. She was dealing with her legal issues then. Says she had taken an OD once. She was [age] years then. She was having issues with her boyfriend then. Patient has been followed at [facility]. She is on Sertraline 100 mg daily, Abilify 5 mg daily and Seroquel 100 mg at bedtime. Says she could not tolerate higher dose of Abilify. No  past history of violent behavior. No past history of mania. No past history of psychosis.  Is the patient at risk to self? Yes.    Has the patient been a risk to self in the past 6 months? No.  Has the patient been a risk to self within the distant past? Yes.    Is the patient a risk to others? No.  Has the patient been a risk to others in the past 6 months? No.  Has the patient been a risk to others within the distant past? No.   Prior Inpatient Therapy: Prior Inpatient Therapy: Yes Prior Outpatient Therapy: Prior Outpatient  Therapy: Yes Does patient have an ACCT team?: No Does patient have Intensive In-House Services?  : No Does patient have [facility] services? : No Does patient have P4CC services?: No  Alcohol Screening: 1. How often do you have a drink containing alcohol?: Never 2. How many drinks containing alcohol do you have on a typical day when you are drinking?: 1 or 2 3. How often do you have six or more drinks on one occasion?: Never AUDIT-C Score: 0 9. Have you or someone else been injured as a result of your drinking?: No 10. Has a relative or friend or a doctor or another health worker been concerned about your drinking or suggested you cut down?: No Alcohol Use Disorder Identification Test Final Score (AUDIT): 0 Intervention/Follow-up: AUDIT Score <7 follow-up not indicated Substance Abuse History in the last 12 months:  Yes.   Consequences of Substance Abuse: As above  Previous Psychotropic Medications: Yes  Psychological Evaluations: Yes  Past Medical History:  Past Medical History:  Diagnosis Date  . Anxiety   . Depression   . Hypertension    History reviewed. No pertinent surgical history. Family History: History reviewed. No pertinent family history. Family Psychiatric  History: Strong family history of depression and Bipolar Disorder. No family history of suicide.  Tobacco Screening: Have you used any form of tobacco in the last 30 days? (Cigarettes, Smokeless Tobacco, Cigars, and/or Pipes): Yes Tobacco use, Select all that apply: 5 or more cigarettes per day Are you interested in Tobacco Cessation Medications?: Yes, will notify MD for an order Counseled patient on smoking cessation including recognizing danger situations, developing coping skills and basic information about quitting provided: Refused/Declined practical counseling Social History:  Social History   Substance and Sexual Activity  Alcohol Use No     Social History   Substance and Sexual Activity  Drug Use No     Additional Social History: Marital status: Single    Pain Medications: none noted Prescriptions: Seroquel, Abilify, Gabapentin, Zoloft Over the Counter: none noted History of alcohol / drug use?: Yes Name of Substance 1: Cocaine (pt tested positive for this) 1 - Frequency: "I don't do that often"    Patient is on disability. She has five children (12, 15, 19, 22 and 25 years respectively). She has a grandchild. She has never been married. Says they have lived together for many years. She is not currently in an new relationship. She has some support from her grown children. Allergies:   Allergies  Allergen Reactions  . Erythromycin Base     anaphylaxis   Lab Results: No results found for this or any previous visit (from the past 48 hour(s)).  Blood Alcohol level:  No results found for: [facility]  Metabolic Disorder Labs:  No results found for: HGBA1C, MPG No results found for: PROLACTIN No results found for: CHOL, TRIG,  HDL, CHOLHDL, VLDL, LDLCALC  Current Medications: Current Facility-Administered Medications  Medication Dose Route Frequency Provider Last Rate Last Dose  . acetaminophen (TYLENOL) tablet 650 mg  650 mg Oral Q6H PRN Oneta RackLewis, Tanika N, NP   650 mg at 10/19/17 2118  . alum & mag hydroxide-simeth (MAALOX/MYLANTA) 200-200-20 MG/5ML suspension 30 mL  30 mL Oral Q4H PRN Oneta RackLewis, Tanika N, NP      . FLUoxetine (PROZAC) capsule 10 mg  10 mg Oral Daily Oneta RackLewis, Tanika N, NP   10 mg at 10/19/17 1622  . hydrOXYzine (ATARAX/VISTARIL) tablet 25 mg  25 mg Oral TID PRN Oneta RackLewis, Tanika N, NP   25 mg at 10/19/17 2118  . lisinopril (PRINIVIL,ZESTRIL) tablet 5 mg  5 mg Oral Daily Oneta RackLewis, Tanika N, NP   5 mg at 10/19/17 1622  . magnesium hydroxide (MILK OF MAGNESIA) suspension 30 mL  30 mL Oral Daily PRN Oneta RackLewis, Tanika N, NP      . nicotine (NICODERM CQ - dosed in mg/24 hours) patch 21 mg  21 mg Transdermal Daily Cobos, Rockey SituFernando A, MD   21 mg at 10/19/17 1623  . QUEtiapine (SEROQUEL) tablet 100 mg   100 mg Oral QHS,MR X 1 Kerry HoughSimon, Spencer E, PA-C   100 mg at 10/19/17 2256   PTA Medications: Medications Prior to Admission  Medication Sig Dispense Refill Last Dose  . albuterol (PROVENTIL HFA;VENTOLIN HFA) 108 (90 Base) MCG/ACT inhaler Inhale 1-2 puffs into the lungs every 4 (four) hours as needed for wheezing or shortness of breath. (Patient taking differently: Inhale 2 puffs into the lungs every 4 (four) hours as needed for wheezing or shortness of breath. ) 1 Inhaler 0 Past Month at Unknown time  . buprenorphine-naloxone (SUBOXONE) 8-2 mg SUBL SL tablet Place 0.5 tablets under the tongue every 12 (twelve) hours.   Past Week at Unknown time  . gabapentin (NEURONTIN) 600 MG tablet Take 600 mg by mouth 3 (three) times daily.   10/18/2017  . QUEtiapine (SEROQUEL) 50 MG tablet Take 50 mg by mouth See admin instructions. 1 tablet three times daily  and 4 tablets at bedtime   10/18/2017  . FLUoxetine (PROZAC) 10 MG capsule Take 1 capsule (10 mg total) by mouth daily. (Patient not taking: Reported on 10/19/2017) 30 capsule 0 Not Taking at Unknown time  . hydrOXYzine (ATARAX/VISTARIL) 25 MG tablet Take 1 tablet (25 mg total) by mouth 4 (four) times daily as needed for anxiety. (Patient not taking: Reported on 10/19/2017) 30 tablet 0 Not Taking at Unknown time  . ibuprofen (ADVIL,MOTRIN) 600 MG tablet Take 1 tablet (600 mg total) by mouth every 6 (six) hours as needed for moderate pain. (Patient not taking: Reported on 10/19/2017) 30 tablet 0 Not Taking at Unknown time  . lidocaine (LIDODERM) 5 % Place 1 patch onto the skin daily. Remove & Discard patch within 12 hours or as directed by MD (Patient not taking: Reported on 10/19/2017) 30 patch 0 Not Taking at Unknown time  . lisinopril (PRINIVIL,ZESTRIL) 5 MG tablet Take 1 tablet (5 mg total) by mouth daily. (Patient not taking: Reported on 10/19/2017) 30 tablet 0 Not Taking at Unknown time    Musculoskeletal: Strength & Muscle Tone: within normal limits Gait &  Station: normal Patient leans: N/A  Psychiatric Specialty Exam: Physical Exam  Constitutional: She appears well-developed and well-nourished.  HENT:  Head: Normocephalic and atraumatic.  Respiratory: Effort normal.  Neurological: She is alert.  Psychiatric:  As above     ROS  Blood pressure 123/85, pulse 90, temperature 98.7 F (37.1 C), resp. rate 16, height 5' (1.524 m), weight 54 kg (119 lb).Body mass index is 23.24 kg/m.  General Appearance: She was in bed sleeping just before interview. Slightly withdrawn. Warmed up a bit as interview progressed. Not internally distracted.   Eye Contact:  Good  Speech:  Soft spoken. Not pressured  Volume:  Decreased  Mood:  Depressed and Hopeless  Affect:  Blunted and mood congruent.   Thought Process:  Linear  Orientation:  Full (Time, Place, and Person)  Thought Content:  Negative ruminations, hopelessness and worthlessness. No hallucination in any modality. No delusional theme. No preoccupation with violent thoughts.   Suicidal Thoughts:  No  Homicidal Thoughts:  No  Memory:  Immediate;   Good Recent;   Good Remote;   Good  Judgement:  Fair  Insight:  Good  Psychomotor Activity:  Decreased  Concentration:  Concentration: Fair  Recall:  Good  Fund of Knowledge:  She has a history of TBI. She is a bit slow cognitively.  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 She has a past history of unipolar depression. Current episode is precipitated by multiple psychosocial factors. She has been coping in a maladaptive way with cocaine. Patient has agreed to get back on her medications. We plan to slowly titrate her back to her stable dose.  Psychiatric: MDD  Recurrent SUD  Medical: TBI COPD HTN  Psychosocial:  Loss of custody of her younger kids Loss of her long term relationship Chaos at her daughter's place  PLAN: 1. Aripiprazole 2 mg daily 2. Sertraline 25 mg daily. Would titrate gradually 3. Mirtazapine 15 mg at bedtime.  4. Gabapentin 300 mg TID 5. Continue Lisinopril and other home medical medications at home dose 6. Encourage unit groups and activities 7. Monitor mood, behavior and interaction with peers 8. SW would gather collateral from her family 42. SW would coordinate discharge and aftercare.    Observation Level/Precautions:  15 minute checks  Laboratory:    Psychotherapy:    Medications:    Consultations:    Discharge Concerns:    Estimated LOS:  Other:     Physician Treatment Plan for Primary Diagnosis: <principal problem not specified> Long Term Goal(s): Improvement in symptoms so as ready for discharge  Short Term Goals: Ability to identify changes in lifestyle to reduce recurrence of condition will improve, Ability to verbalize feelings will improve, Ability to disclose and discuss suicidal ideas, Ability to demonstrate self-control will improve, Ability to identify and develop effective coping behaviors will improve, Ability to maintain clinical measurements within normal limits will improve, Compliance with prescribed medications will improve and Ability to identify triggers associated with substance abuse/mental health issues will improve  Physician Treatment Plan for Secondary Diagnosis: Active Problems:   MDD (major depressive disorder)  Long Term Goal(s): Improvement in symptoms so as ready for discharge  Short Term Goals: Ability to identify changes in lifestyle to reduce recurrence of condition will improve, Ability to verbalize feelings will improve, Ability to disclose and discuss suicidal ideas, Ability to demonstrate self-control will improve, Ability to identify and develop effective coping  behaviors will improve, Ability to maintain clinical measurements within normal limits will  improve, Compliance with prescribed medications will improve and Ability to identify triggers associated with substance abuse/mental health issues will improve  I certify that inpatient services furnished can reasonably be expected to improve the patient's condition.    Georgiann Cocker, MD 12/5/201811:25 AM

## 2017-10-20 NOTE — Progress Notes (Signed)
Patient states that she had a pretty good day overall. The patient made reference to dealing with a change in her life but she did not go into further detail. The patient also mentioned that she worked on taking her medication as prescribed today. As for the theme of the day, her personal development will involve focusing more on herself.

## 2017-10-20 NOTE — Tx Team (Signed)
Interdisciplinary Treatment and Diagnostic Plan Update  10/20/2017 Time of Session: [place] MRN: [id number]  Principal Diagnosis: MDD, recurrent, severe  Secondary Diagnoses: Active Problems:   MDD (major depressive disorder)   Current Medications:  Current Facility-Administered Medications  Medication Dose Route Frequency Provider Last Rate Last Dose  . acetaminophen (TYLENOL) tablet 650 mg  650 mg Oral Q6H PRN Derrill Center, NP   650 mg at 10/19/17 2118  . alum & mag hydroxide-simeth (MAALOX/MYLANTA) 200-200-20 MG/5ML suspension 30 mL  30 mL Oral Q4H PRN Derrill Center, NP      . FLUoxetine (PROZAC) capsule 10 mg  10 mg Oral Daily Derrill Center, NP   10 mg at 10/19/17 1622  . hydrOXYzine (ATARAX/VISTARIL) tablet 25 mg  25 mg Oral TID PRN Derrill Center, NP   25 mg at 10/19/17 2118  . lisinopril (PRINIVIL,ZESTRIL) tablet 5 mg  5 mg Oral Daily Derrill Center, NP   5 mg at 10/19/17 1622  . magnesium hydroxide (MILK OF MAGNESIA) suspension 30 mL  30 mL Oral Daily PRN Derrill Center, NP      . nicotine (NICODERM CQ - dosed in mg/24 hours) patch 21 mg  21 mg Transdermal Daily Cobos, Myer Peer, MD   21 mg at 10/19/17 1623  . QUEtiapine (SEROQUEL) tablet 100 mg  100 mg Oral QHS,MR X 1 Laverle Hobby, PA-C   100 mg at 10/19/17 2256   PTA Medications: Medications Prior to Admission  Medication Sig Dispense Refill Last Dose  . albuterol (PROVENTIL HFA;VENTOLIN HFA) 108 (90 Base) MCG/ACT inhaler Inhale 1-2 puffs into the lungs every 4 (four) hours as needed for wheezing or shortness of breath. (Patient taking differently: Inhale 2 puffs into the lungs every 4 (four) hours as needed for wheezing or shortness of breath. ) 1 Inhaler 0 Past Month at Unknown time  . buprenorphine-naloxone (SUBOXONE) 8-2 mg SUBL SL tablet Place 0.5 tablets under the tongue every 12 (twelve) hours.   Past Week at Unknown time  . gabapentin (NEURONTIN) 600 MG tablet Take 600 mg by mouth 3 (three)  times daily.   10/18/2017  . QUEtiapine (SEROQUEL) 50 MG tablet Take 50 mg by mouth See admin instructions. 1 tablet three times daily  and 4 tablets at bedtime   10/18/2017  . FLUoxetine (PROZAC) 10 MG capsule Take 1 capsule (10 mg total) by mouth daily. (Patient not taking: Reported on 10/19/2017) 30 capsule 0 Not Taking at Unknown time  . hydrOXYzine (ATARAX/VISTARIL) 25 MG tablet Take 1 tablet (25 mg total) by mouth 4 (four) times daily as needed for anxiety. (Patient not taking: Reported on 10/19/2017) 30 tablet 0 Not Taking at Unknown time  . ibuprofen (ADVIL,MOTRIN) 600 MG tablet Take 1 tablet (600 mg total) by mouth every 6 (six) hours as needed for moderate pain. (Patient not taking: Reported on 10/19/2017) 30 tablet 0 Not Taking at Unknown time  . lidocaine (LIDODERM) 5 % Place 1 patch onto the skin daily. Remove & Discard patch within 12 hours or as directed by MD (Patient not taking: Reported on 10/19/2017) 30 patch 0 Not Taking at Unknown time  . lisinopril (PRINIVIL,ZESTRIL) 5 MG tablet Take 1 tablet (5 mg total) by mouth daily. (Patient not taking: Reported on 10/19/2017) 30 tablet 0 Not Taking at Unknown time    Patient Stressors: Financial difficulties Marital or family conflict Medication change or noncompliance Substance abuse  Patient Strengths: [employer] of knowledge Physical Health  Treatment Modalities: Medication Management, Group therapy, Case management,  1 to 1 session with clinician, Psychoeducation, Recreational therapy.   Physician Treatment Plan for Primary Diagnosis:  MDD, recurrent, severe  Medication Management: Evaluate patient's response, side effects, and tolerance of medication regimen.  Therapeutic Interventions: 1 to 1 sessions, Unit Group sessions and Medication administration.  Evaluation of Outcomes: Not Met  Physician Treatment Plan for Secondary Diagnosis: Active Problems:   MDD (major depressive disorder)  Medication Management: Evaluate  patient's response, side effects, and tolerance of medication regimen.  Therapeutic Interventions: 1 to 1 sessions, Unit Group sessions and Medication administration.  Evaluation of Outcomes: Not Met   RN Treatment Plan for Primary Diagnosis:  MDD, recurrent, severe Long Term Goal(s): Knowledge of disease and therapeutic regimen to maintain health will improve  Short Term Goals: Ability to remain free from injury will improve, Ability to demonstrate self-control and Ability to disclose and discuss suicidal ideas  Medication Management: RN will administer medications as ordered by provider, will assess and evaluate patient's response and provide education to patient for prescribed medication. RN will report any adverse and/or side effects to prescribing provider.  Therapeutic Interventions: 1 on 1 counseling sessions, Psychoeducation, Medication administration, Evaluate responses to treatment, Monitor vital signs and CBGs as ordered, Perform/monitor CIWA, COWS, AIMS and Fall Risk screenings as ordered, Perform wound care treatments as ordered.  Evaluation of Outcomes: Not Met   LCSW Treatment Plan for Primary Diagnosis:  MDD, recurrent, severe Long Term Goal(s): Safe transition to appropriate next level of care at discharge, Engage patient in therapeutic group addressing interpersonal concerns.  Short Term Goals: Engage patient in aftercare planning with referrals and resources, Increase emotional regulation and Facilitate patient progression through stages of change regarding substance use diagnoses and concerns  Therapeutic Interventions: Assess for all discharge needs, 1 to 1 time with Social worker, Explore available resources and support systems, Assess for adequacy in community support network, Educate family and significant other(s) on suicide prevention, Complete Psychosocial Assessment, Interpersonal group therapy.  Evaluation of Outcomes: Not Met   Progress in  Treatment: Attending groups: No. Participating in groups: No.New to unit. Continuing to assess. Taking medication as prescribed: Yes. Toleration medication: Yes. Family/Significant other contact made: No, will contact:  family member if patient consents Patient understands diagnosis: Yes. Discussing patient identified problems/goals with staff: Yes. Medical problems stabilized or resolved: Yes. Denies suicidal/homicidal ideation: Yes. Issues/concerns per patient self-inventory: No. Other: n/a   New problem(s) identified: No, Describe:  n/a  New Short Term/Long Term Goal(s): elimination of SI thoughts; detox, development of comprehensive mental wellness/sobriety plan.   Discharge Plan or Barriers: Pt goes to [facility] for treatment. Last admission was at [facility] 07/14/[facility]. Pt provided with [facility] pamphlet and AA/NA information provided.   Reason for Continuation of Hospitalization: Anxiety Depression Medication stabilization Suicidal ideation Withdrawal symptoms  Estimated Length of Stay: Monday, 10/25/17  Attendees: Patient: 10/20/2017 8:51 AM  Physician: Dr. Sanjuana Letters MD; Dr. Nancy Fetter MD 10/20/2017 8:51 AM  Nursing: Chrys Racer; Kieth Brightly RN 10/20/2017 8:51 AM  RN Care Manager: Lars Pinks CM 10/20/2017 8:51 AM  Social Worker: Maxie Better, LCSW 10/20/2017 8:51 AM  Recreational Therapist: x 10/20/2017 8:51 AM  Other: Lindell Spar NP; Darnelle Maffucci Money NP 10/20/2017 8:51 AM  Other:  10/20/2017 8:51 AM  Other: 10/20/2017 8:51 AM    Scribe for Treatment Team: [place], LCSW 10/20/2017 8:51 AM
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
## 2017-10-20 NOTE — Progress Notes (Signed)
D   Pt continues to ask for Seroquel and after she found out she could only get it if the remeron did not work, she stayed up in the dayroom for 50 minutes and said the remeron did not make her sleepy   Pt was informed she needed to go lay down to see if the Remeron works instead of fighting it by sitting in the dayroom   Pt seems to be determined to get seroquel  A   Verbal support given    Medications administered and effectiveness monitored    Q 15 min checks R   Pt remains safe and was receptive to verbal support

## 2017-10-20 NOTE — Progress Notes (Signed)
Recreation Therapy Notes  Date:  10/20/17  Time: 0930 Location: 300 Hall Group Room  Group Topic: Stress Management  Goal Area(s) Addresses:  Patient will verbalize importance of using healthy stress management.  Patient will identify positive emotions associated with healthy stress management.   Intervention: Stress Management  Activity :  Meditation.  LRT introduced the stress management technique of meditation.  Patients were to follow along with the meditation as it guided them on letting things that are burdensome in order to progress.  Education:  Stress Management, Discharge Planning.   Education Outcome: Acknowledges edcuation/In group clarification offered/Needs additional education  Clinical Observations/Feedback: Pt did not attend group.    Caroll RancherMarjette Gurneet Matarese, LRT/CTRS         Caroll RancherLindsay, Erik Burkett A 10/20/2017 12:06 PM

## 2017-10-20 NOTE — BHH Suicide Risk Assessment (Signed)
[facility] INPATIENT:  Family/Significant Other Suicide Prevention Education  Suicide Prevention Education:  Education Completed; son Fuller SongJason Valdez [phone] has been identified by the patient as the family member/significant other with whom the patient will be residing, and identified as the person(s) who will aid the patient in the event of a mental health crisis (suicidal ideations/suicide attempt).  With written consent from the patient, the family member/significant other has been provided the following suicide prevention education, prior to the and/or following the discharge of the patient.  The suicide prevention education provided includes the following:  Suicide risk factors  Suicide prevention and interventions  National Suicide Hotline telephone number  [facility] assessment telephone number  [facility] Emergency Assistance 911  [facility] and/or Residential Mobile Crisis Unit telephone number  Request made of family/significant other to:  Remove weapons (e.g., guns, rifles, knives), all items previously/currently identified as safety concern.    Remove drugs/medications (over-the-counter, prescriptions, illicit drugs), all items previously/currently identified as a safety concern.  The family member/significant other verbalizes understanding of the suicide prevention education information provided.  The family member/significant other agrees to remove the items of safety concern listed above.  Pt's son says there were never any guns in pt's home and he does not know what gun she would have held to her head for 7 hours.  Wonders if she just felt that way and said it as though it actually happened.  Will ask his mother for location of any actual gun and remove it.  Feels she sounds like his old mother now that she is back on her medication, is excited about that.  Helen JaegerMareida J Valdez 10/20/2017, 6:49 PM

## 2017-10-21 DIAGNOSIS — F39 Unspecified mood [affective] disorder: Secondary | ICD-10-CM

## 2017-10-21 DIAGNOSIS — R451 Restlessness and agitation: Secondary | ICD-10-CM

## 2017-10-21 LAB — URINALYSIS, ROUTINE W REFLEX MICROSCOPIC
BACTERIA UA: NONE SEEN
Bilirubin Urine: NEGATIVE
Glucose, UA: NEGATIVE mg/dL
Ketones, ur: NEGATIVE mg/dL
LEUKOCYTES UA: NEGATIVE
NITRITE: NEGATIVE
PH: 6 (ref 5.0–8.0)
Protein, ur: NEGATIVE mg/dL
SPECIFIC GRAVITY, URINE: 1.009 (ref 1.005–1.030)

## 2017-10-21 MED ORDER — SERTRALINE HCL 50 MG PO TABS
50.0000 mg | ORAL_TABLET | Freq: Every day | ORAL | Status: DC
  Administered 2017-10-22: 50 mg via ORAL
  Filled 2017-10-21 (×3): qty 1

## 2017-10-21 MED ORDER — QUETIAPINE FUMARATE 200 MG PO TABS
200.0000 mg | ORAL_TABLET | Freq: Every evening | ORAL | Status: DC | PRN
  Administered 2017-10-21: 200 mg via ORAL
  Filled 2017-10-21: qty 2

## 2017-10-21 MED ORDER — TRAZODONE HCL 50 MG PO TABS
50.0000 mg | ORAL_TABLET | Freq: Once | ORAL | Status: AC
  Administered 2017-10-21: 50 mg via ORAL
  Filled 2017-10-21 (×2): qty 1
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
## 2017-10-21 NOTE — Progress Notes (Signed)
Pt attended evening wrap up group and stated that today was a good day for her, she's had more energy and has a goal of staying on her medication so she "gets better".

## 2017-10-21 NOTE — Progress Notes (Signed)
[facility] MD Progress Note  10/21/2017 2:11 PM Helen Valdez  MRN:  [id number]   Subjective:  Patient reports that she feels much better and is ready top go home. She denies any SI/HI/AVH and contracts for safety. SH estates that now that she has restarted her medications she feels fine. She reports that after being off of her medications for a month that was the real problem. She complains that she had a vivid nightmare about a shark eating her leg last night and she does not want the Remeron anymore and she will not take it tonight. She requests to be tested for GC/Chlamydia due to insecurities with her boyfriend.   Objective: Patient's chart and findings reviewed and discussed with treatment team. Patient presents pleasant and cooperative. She has been seen in  The day room interacting appropriately. After discussing medications due to taking 2 antipsychotic, will discontinue the Abilify and increase Seroquel 200 mg QHS. Will discontinue Remeron due to complaint of nightmares with the medication. Will increase the Zoloft to 50 mg Daily. I offered patient HIV testing as well and patient refused.  Principal Problem: MDD (major depressive disorder) Diagnosis:   Patient Active Problem List   Diagnosis Date Noted  . TBI (traumatic brain injury) (HCC) [S06.9X9A] 07/15/2016  . PTSD (post-traumatic stress disorder) [F43.10] 07/15/2016  . MDD (major depressive disorder) [F32.9] 07/14/2016  . COPD (chronic obstructive pulmonary disease) (HCC) [J44.9] 07/14/2016  . HTN (hypertension) [I10] 07/14/2016  . Tobacco use disorder [F17.200] 07/14/2016   Total Time spent with patient: 25 minutes  Past Psychiatric History: See H&P  Past Medical History:  Past Medical History:  Diagnosis Date  . Anxiety   . Depression   . Hypertension    History reviewed. No pertinent surgical history. Family History: History reviewed. No pertinent family history. Family Psychiatric  History: See H&P Social History:   Social History   Substance and Sexual Activity  Alcohol Use No     Social History   Substance and Sexual Activity  Drug Use No    Social History   Socioeconomic History  . Marital status: Single    Spouse name: None  . Number of children: None  . Years of education: None  . Highest education level: None  Social Needs  . Financial resource strain: None  . Food insecurity - worry: None  . Food insecurity - inability: None  . Transportation needs - medical: None  . Transportation needs - non-medical: None  Occupational History  . None  Tobacco Use  . Smoking status: Current Every Day Smoker    Types: Cigarettes  . Smokeless tobacco: Never Used  Substance and Sexual Activity  . Alcohol use: No  . Drug use: No  . Sexual activity: Yes  Other Topics Concern  . None  Social History Narrative  . None   Additional Social History:    Pain Medications: none noted Prescriptions: Seroquel, Abilify, Gabapentin, Zoloft Over the Counter: none noted History of alcohol / drug use?: Yes Name of Substance 1: Cocaine (pt tested positive for this) 1 - Frequency: "I don't do that often"                  Sleep: Good  Appetite:  Good  Current Medications: Current Facility-Administered Medications  Medication Dose Route Frequency Provider Last Rate Last Dose  . acetaminophen (TYLENOL) tablet 650 mg  650 mg Oral Q6H PRN Oneta Rack, NP   650 mg at 10/21/17 1050  . alum &  mag hydroxide-simeth (MAALOX/MYLANTA) 200-200-20 MG/5ML suspension 30 mL  30 mL Oral Q4H PRN Oneta Rack, NP      . gabapentin (NEURONTIN) capsule 300 mg  300 mg Oral TID Georgiann Cocker, MD   300 mg at 10/21/17 1143  . lisinopril (PRINIVIL,ZESTRIL) tablet 5 mg  5 mg Oral Daily Oneta Rack, NP   5 mg at 10/21/17 0820  . magnesium hydroxide (MILK OF MAGNESIA) suspension 30 mL  30 mL Oral Daily PRN Oneta Rack, NP      . nicotine (NICODERM CQ - dosed in mg/24 hours) patch 21 mg  21 mg  Transdermal Daily Cobos, Rockey Situ, MD   21 mg at 10/21/17 1050  . QUEtiapine (SEROQUEL) tablet 200 mg  200 mg Oral QHS PRN Kynedi Profitt, Gerlene Burdock, FNP      . [START ON 10/22/2017] sertraline (ZOLOFT) tablet 50 mg  50 mg Oral Daily Griffith Santilli, Gerlene Burdock, FNP        Lab Results: No results found for this or any previous visit (from the past 48 hour(s)).  Blood Alcohol level:  No results found for: [facility]  Metabolic Disorder Labs: No results found for: HGBA1C, MPG No results found for: PROLACTIN No results found for: CHOL, TRIG, HDL, CHOLHDL, VLDL, LDLCALC  Physical Findings: AIMS: Facial and Oral Movements Muscles of Facial Expression: None, normal Lips and Perioral Area: None, normal Jaw: None, normal Tongue: None, normal,Extremity Movements Upper (arms, wrists, hands, fingers): None, normal Lower (legs, knees, ankles, toes): None, normal, Trunk Movements Neck, shoulders, hips: None, normal, Overall Severity Severity of abnormal movements (highest score from questions above): None, normal Incapacitation due to abnormal movements: None, normal Patient's awareness of abnormal movements (rate only patient's report): No Awareness, Dental Status Current problems with teeth and/or dentures?: No Does patient usually wear dentures?: No  CIWA:    COWS:     Musculoskeletal: Strength & Muscle Tone: within normal limits Gait & Station: normal Patient leans: N/A  Psychiatric Specialty Exam: Physical Exam  Nursing note and vitals reviewed. Constitutional: She is oriented to person, place, and time. She appears well-developed and well-nourished.  Cardiovascular: Normal rate.  Respiratory: Effort normal.  Musculoskeletal: Normal range of motion.  Neurological: She is alert and oriented to person, place, and time.  Skin: Skin is warm.    Review of Systems  Constitutional: Negative.   HENT: Negative.   Eyes: Negative.   Respiratory: Negative.   Cardiovascular: Negative.   Gastrointestinal:  Negative.   Genitourinary: Negative.   Musculoskeletal: Negative.   Skin: Negative.   Neurological: Negative.   Endo/Heme/Allergies: Negative.   Psychiatric/Behavioral: Negative.     Blood pressure 122/88, pulse 96, temperature 98.7 F (37.1 C), resp. rate 16, height 5' (1.524 m), weight 54 kg (119 lb).Body mass index is 23.24 kg/m.  General Appearance: Casual  Eye Contact:  Good  Speech:  Clear and Coherent and Normal Rate  Volume:  Normal  Mood:  Euthymic  Affect:  Appropriate  Thought Process:  Goal Directed and Descriptions of Associations: Intact  Orientation:  Full (Time, Place, and Person)  Thought Content:  WDL  Suicidal Thoughts:  No  Homicidal Thoughts:  No  Memory:  Immediate;   Good Recent;   Good Remote;   Good  Judgement:  Good  Insight:  Good  Psychomotor Activity:  Normal  Concentration:  Concentration: Good and Attention Span: Good  Recall:  Good  Fund of Knowledge:  Good  Language:  Good  Akathisia:  No  Handed:  Right  AIMS (if indicated):     Assets:  Communication Skills Desire for Improvement Financial Resources/Insurance Housing Physical Health Social Support Transportation  ADL's:  Intact  Cognition:  WNL  Sleep:  Number of Hours: 6.5   Problems Addressed: MDD  Treatment Plan Summary: Daily contact with patient to assess and evaluate symptoms and progress in treatment, Medication management and Plan is to:  -Discontinue Abilify -Discontinue Remeron -Increase Seroquel 200 mg PO QHS for mood stability -Increase Zoloft 50 mg PO Daily for mood stability -Continue Gabapentin 300 mg TID for agitation -Encourage group therapy participation -GC, RPR, and Chlamydia panel ordered  Maryfrances Bunnellravis B Johniya Durfee, FNP 10/21/2017, 2:11 PM

## 2017-10-22 LAB — RPR: RPR: NONREACTIVE

## 2017-10-22 LAB — GC/CHLAMYDIA PROBE AMP (~~LOC~~) NOT AT ARMC
CHLAMYDIA, DNA PROBE: NEGATIVE
Neisseria Gonorrhea: NEGATIVE

## 2017-10-22 MED ORDER — GABAPENTIN 300 MG PO CAPS: 300.0000 mg | ORAL_CAPSULE | Freq: Three times a day (TID) | ORAL | 0 refills | Status: DC

## 2017-10-22 MED ORDER — QUETIAPINE FUMARATE 200 MG PO TABS: 200.0000 mg | ORAL_TABLET | Freq: Every evening | ORAL | 0 refills | Status: DC | PRN

## 2017-10-22 MED ORDER — MIRTAZAPINE 7.5 MG PO TABS: 7.5000 mg | ORAL_TABLET | Freq: Every day | ORAL | 0 refills | Status: DC

## 2017-10-22 MED ORDER — LISINOPRIL 5 MG PO TABS: 5.0000 mg | ORAL_TABLET | Freq: Every day | ORAL | 0 refills | Status: DC

## 2017-10-22 MED ORDER — SERTRALINE HCL 50 MG PO TABS: 50.0000 mg | ORAL_TABLET | Freq: Every day | ORAL | 0 refills | Status: DC

## 2017-10-22 NOTE — Progress Notes (Signed)
Recreation Therapy Notes  Date: 10/22/17 Time: 0930 Location: 300 Hall Dayroom  Group Topic: Stress Management  Goal Area(s) Addresses:  Patient will verbalize importance of using healthy stress management.  Patient will identify positive emotions associated with healthy stress management.   Intervention: Stress Management  Activity :  LRT introduced the stress management technique of meditation.  LRT read a script to allow patients to focus on being steadfast in different situations.  Education:  Stress Management, Discharge Planning.   Education Outcome: Acknowledges edcuation/In group clarification offered/Needs additional education  Clinical Observations/Feedback: Pt did not attend group.    Maylani Embree, LRT/CTRS         Daisy Lites A 10/22/2017 10:39 AM 

## 2017-10-22 NOTE — Progress Notes (Signed)
D Pt is seen OOB UAL on the 400 hall ...toelrated fair. She is anxious. She ruminates, worrying about the same thing " I don't know how I'm going to get home....what do I do...". A She takes her medications as planned and she contracts for safety with this nurse. She is planning to be dc'd later today. R Safety in place an dpoc cont.

## 2017-10-22 NOTE — Progress Notes (Signed)
Jenita discharged at this time to daughter. She denies SI and all discharge paperwork was discussed with patient and she verbalized understanding. Prescriptions provided and all belongings were returned. Keaira left alert and ambulatory.

## 2017-10-22 NOTE — Progress Notes (Signed)
Patient ID: Helen ShropshireVickie L Valdez, female   DOB: 22-Apr-1971, 46 y.o.   MRN: [id number]  DAR Note: Pt observed isolated to room. Pt at the time of assessment endorsed moderate anxiety and depression; "I feel a little better now but I worry I may not be able to sleep." Pt denied pain, SI, HI or AVH. Support, encouragement, and safe environment provided.  15-minute safety checks continue. All Pt's questions and concerns addressed. Pt was med compliant. Pt did attend wrap-up group.

## 2017-10-22 NOTE — Discharge Summary (Signed)
Physician Discharge Summary Note  Patient:  Helen Valdez is an 46 y.o., female MRN:  [id number] DOB:  1971/02/22 Patient phone:  [phone] (home)  Patient address:   [address] [place] [address],  Total Time spent with patient: 20 minutes  Date of Admission:  10/19/2017 Date of Discharge: 10/22/17   Reason for Admission:  Worsening depression with cocaine abuse and SI  Principal Problem: MDD (major depressive disorder) Discharge Diagnoses: Patient Active Problem List   Diagnosis Date Noted  . TBI (traumatic brain injury) (HCC) [S06.9X9A] 07/15/2016  . PTSD (post-traumatic stress disorder) [F43.10] 07/15/2016  . MDD (major depressive disorder) [F32.9] 07/14/2016  . COPD (chronic obstructive pulmonary disease) (HCC) [J44.9] 07/14/2016  . HTN (hypertension) [I10] 07/14/2016  . Tobacco use disorder [F17.200] 07/14/2016    Past Psychiatric History: Patient was admitted once in 2017. She was depressed and suicidal at that time. She was dealing with her legal issues then. Says she had taken an OD once. She was [age] years then. She was having issues with her boyfriend then. [facility] has [facility] followed at [facility]. She is on Sertraline 100 mg daily, Abilify 5 mg daily and Seroquel 100 mg at bedtime. Says she could not tolerate higher dose of Abilify. No past history of violent behavior. No past history of mania. No past history of psychosis.  Past Medical History:  Past Medical History:  Diagnosis Date  . Anxiety   . Depression   . Hypertension    History reviewed. No pertinent surgical history. Family History: History reviewed. No pertinent family history. Family Psychiatric  History: Strong family history of depression and Bipolar Disorder. No family history of suicide  Social History:  Social History   Substance and Sexual Activity  Alcohol Use No     Social History   Substance and Sexual Activity  Drug Use No    Social History   Socioeconomic History   . Marital status: Single    Spouse name: None  . Number of children: None  . Years of education: None  . Highest education level: None  Social Needs  . Financial resource strain: None  . Food insecurity - worry: None  . Food insecurity - inability: None  . Transportation needs - medical: None  . Transportation needs - non-medical: None  Occupational History  . None  Tobacco Use  . Smoking status: Current Every Day Smoker    Types: Cigarettes  . Smokeless tobacco: Never Used  Substance and Sexual Activity  . Alcohol use: No  . Drug use: No  . Sexual activity: Yes  Other Topics Concern  . None  Social History Narrative  . None    Hospital Course:   [facility] Assessment: 46 y.o Caucasian female, single, lives with her daughter, on disablity. Background history of MDD, SUD, TBI. [facility] in company of her daughter. She has been feeling very depressed lately. She has been using cocaine to cope. She has been off her medications for about a month. Patient held a gun to own head for seven hours. She was very distraught and tearful at the ER. Main stressors is her family dynamics.  Routine labs are essentially normal, toxicology is negative,  UDS is positive for cocaine and opiates. At interview, patient reports worsening depression since she got out of prison. She was incarcerated from December 2016 to November 2017. Says when she got out of prison, she found out that her partner has been having  an affair. Says they have been together for seventeen years. Says they have minor children together. Patient says by January of this year, he asked her to leave his house. Patient says she got an apartment of her own for a while but later moved in with one of her grown daughters. Patient says she has been very depressed since then  " I have not been myself ,,,,, this is a huge change for me ,,, I have always been used to being a mom ,,,, I have not seen my minor kids since  April last year ,,,,, things aren't right at my daughter's place ,,,, there is a lot of fighting and bickering ,,,, I do not get int bed early ,,,, no peace there ,,,,, so much on my plate ,,,, I miss my kids" . Patient says the final straw was last Monday, she found out that her kids father has taken the kids out of state. Says he told her she would never see them again. Patient says she has been feeling suicidal since then. She feels as if there is nothing to live for anymore. Says she is not eating, she has lost four pounds. She has been crying a lot. Says she has been feeling hopeless and worthless. She felt like shooting herself. Says she knows it is not the right thing. A part of her held back. Patient says she wants to get better. No current suicidal thoughts. No homicidal thoughts. No thoughts of violence. She had just one gun in the house. Says it has been secured. No evidence of psychosis. No evidence of mania. No pending legal issues. Says she is on probation for three years.    [facility] and stabilized with medications and therapy. Patient was restarted on Seroquel and titrated to 200 mg QHS and Zoloft and titrated to 50 mg Daily. She was also started on Remeron and titrated to 7.5 mg QHS and Gabapentin 300 mg TID. Patient showed improvement with improved mood, affect, sleep, interaction, and appetite. She was seen in the day room interacting with peers and staff appropriately. She has been attending groups. She denies any SI/HI/AVh and contracts for safety. She states that she was using cocaine and that was the reason for the suicidal thoughts. She states that she will return back home and that she needs [place] back [place]. Patient agrees to follow up at [facility]. She is provided with prescriptions for her medications upon discharge.    Physical Findings: AIMS: Facial and Oral Movements Muscles of Facial Expression:  None, normal Lips and Perioral Area: None, normal Jaw: None, normal Tongue: None, normal,Extremity Movements Upper (arms, wrists, hands, fingers): None, normal Lower (legs, knees, ankles, toes): None, normal, Trunk Movements Neck, shoulders, hips: None, normal, Overall Severity Severity of abnormal movements (highest score from questions above): None, normal Incapacitation due to abnormal movements: None, normal Patient's awareness of abnormal movements (rate only patient's report): No Awareness, Dental Status Current problems with teeth and/or dentures?: No Does patient usually wear dentures?: No  CIWA:    COWS:     Musculoskeletal: Strength & Muscle Tone: within normal limits Gait & Station: normal Patient leans: N/A  Psychiatric Specialty Exam: Physical Exam  Nursing note and vitals reviewed. Constitutional: She is oriented to person, place, and time. She appears well-developed and well-nourished.  Cardiovascular: Normal rate.  Respiratory: Effort normal.  Musculoskeletal: Normal range of motion.  Neurological: She is alert and  oriented to person, place, and time.  Skin: Skin is warm.    Review of Systems  Constitutional: Negative.   HENT: Negative.   Eyes: Negative.   Respiratory: Negative.   Cardiovascular: Negative.   Gastrointestinal: Negative.   Genitourinary: Negative.   Musculoskeletal: Negative.   Skin: Negative.   Neurological: Negative.   Endo/Heme/Allergies: Negative.   Psychiatric/Behavioral: Negative.     Blood pressure 111/74, pulse 76, temperature 98 F (36.7 C), resp. rate 20, height 5' (1.524 m), weight 54 kg (119 lb).Body mass index is 23.24 kg/m.  General Appearance: Casual  Eye Contact:  Good  Speech:  Clear and Coherent and Normal Rate  Volume:  Normal  Mood:  Euthymic  Affect:  Appropriate  Thought Process:  Goal Directed and Descriptions of Associations: Intact  Orientation:  Full (Time, Place, and Person)  Thought Content:  WDL   Suicidal Thoughts:  No  Homicidal Thoughts:  No  Memory:  Immediate;   Good Recent;   Good Remote;   Good  Judgement:  Good  Insight:  Good  Psychomotor Activity:  Normal  Concentration:  Concentration: Good and Attention Span: Good  Recall:  Good  Fund of Knowledge:  Good  Language:  Good  Akathisia:  No  Handed:  Right  AIMS (if indicated):     Assets:  Communication Skills Desire for Improvement Financial Resources/Insurance Housing Physical Health Social Support Transportation  ADL's:  Intact  Cognition:  WNL  Sleep:  Number of Hours: 5     Have you used any form of tobacco in the last 30 days? (Cigarettes, Smokeless Tobacco, Cigars, and/or Pipes): Yes  Has this patient used any form of tobacco in the last 30 days? (Cigarettes, Smokeless Tobacco, Cigars, and/or Pipes) Yes, Yes, A prescription for an FDA-approved tobacco cessation medication was offered at discharge and the patient refused  Blood Alcohol level:  No results found for: [facility]  Metabolic Disorder Labs:  No results found for: HGBA1C, MPG No results found for: PROLACTIN No results found for: CHOL, TRIG, HDL, CHOLHDL, VLDL, LDLCALC  See Psychiatric Specialty Exam and Suicide Risk Assessment completed by Attending Physician prior to discharge.  Discharge destination:  Home  Is patient on multiple antipsychotic therapies at discharge:  No   Has Patient had three or more failed trials of antipsychotic monotherapy by history:  No  Recommended Plan for Multiple Antipsychotic Therapies: NA   Allergies as of 10/22/2017      Reactions   Erythromycin Base    anaphylaxis      Medication List    STOP taking these medications   albuterol 108 (90 Base) MCG/ACT inhaler Commonly known as:  PROVENTIL HFA;VENTOLIN HFA   buprenorphine-naloxone 8-2 mg Subl SL tablet Commonly known as:  SUBOXONE   FLUoxetine 10 MG capsule Commonly known as:  PROZAC   gabapentin 600 MG tablet Commonly known as:   NEURONTIN Replaced by:  gabapentin 300 MG capsule   hydrOXYzine 25 MG tablet Commonly known as:  ATARAX/VISTARIL   ibuprofen 600 MG tablet Commonly known as:  ADVIL,MOTRIN   lidocaine 5 % Commonly known as:  LIDODERM     TAKE these medications     Indication  gabapentin 300 MG capsule Commonly known as:  NEURONTIN Take 1 capsule (300 mg total) by mouth 3 (three) times daily. For agitation Replaces:  gabapentin 600 MG tablet  Indication:  Agitation   lisinopril 5 MG tablet Commonly known as:  PRINIVIL,ZESTRIL Take 1 tablet (5 mg total) by mouth  daily. For high blood pressure Start taking on:  10/23/2017 What changed:  additional instructions  Indication:  High Blood Pressure Disorder   mirtazapine 7.5 MG tablet Commonly known as:  REMERON Take 1 tablet (7.5 mg total) by mouth at bedtime. for mood control  Indication:  mood stability   QUEtiapine 200 MG tablet Commonly known as:  SEROQUEL Take 1 tablet (200 mg total) by mouth at bedtime as needed. What changed:    medication strength  how much to take  when to take this  reasons to take this  additional instructions  Indication:  mood stability   sertraline 50 MG tablet Commonly known as:  ZOLOFT Take 1 tablet (50 mg total) by mouth daily. For mood control  Indication:  Major Depressive Disorder      Follow-up Information    [facility] Follow up on 10/27/2017.   Specialty:  Psychology Why:  Please resume group on Wednesday 12/12 at either 10:00AM/1:00PM/or 5:30PM. Medication management appt with your provider on Saturday, 12/15 at 11:00AM. Thank you.  Contact information: [address] [place] [address] [phone]           Follow-up recommendations:  Continue activity as tolerated. Continue diet as recommended by your PCP. Ensure to keep all appointments with outpatient providers.  Comments:  Patient is instructed prior to discharge to: Take all medications as  prescribed by his/her mental healthcare provider. Report any adverse effects and or reactions from the medicines to his/her outpatient provider promptly. Patient has been instructed & cautioned: To not engage in alcohol and or illegal drug use while on prescription medicines. In the event of worsening symptoms, patient is instructed to call the crisis hotline, 911 and or go to the nearest ED for appropriate evaluation and treatment of symptoms. To follow-up with his/her primary care provider for your other medical issues, concerns and or health care needs.    Signed: Gerlene Burdock Alquan Morrish, FNP 10/22/2017, 9:50 AM

## 2017-10-22 NOTE — BHH Suicide Risk Assessment (Signed)
[facility]   Principal Problem: MDD (major depressive disorder) Discharge Diagnoses:  Patient Active Problem List   Diagnosis Date Noted  . TBI (traumatic brain injury) (HCC) [S06.9X9A] 07/15/2016  . PTSD (post-traumatic stress disorder) [F43.10] 07/15/2016  . MDD (major depressive disorder) [F32.9] 07/14/2016  . COPD (chronic obstructive pulmonary disease) (HCC) [J44.9] 07/14/2016  . HTN (hypertension) [I10] 07/14/2016  . Tobacco use disorder [F17.200] 07/14/2016    Total Time spent with patient: 30 minutes  Musculoskeletal: Strength & Muscle Tone: within normal limits Gait & Station: normal Patient leans: N/A  Psychiatric Specialty Exam: Review of Systems  Constitutional: Negative.   HENT: Negative.   Eyes: Negative.   Respiratory: Negative.   Cardiovascular: Negative.   Gastrointestinal: Negative.   Genitourinary: Negative.   Musculoskeletal: Negative.   Skin: Negative.   Neurological: Negative.   Endo/Heme/Allergies: Negative.   Psychiatric/Behavioral: Negative for depression, hallucinations, memory loss, substance abuse and suicidal ideas. The patient is not nervous/anxious and does not have insomnia.     Blood pressure 111/74, pulse 76, temperature 98 F (36.7 C), resp. rate 20, height 5' (1.524 m), weight 54 kg (119 lb).Body mass index is 23.24 kg/m.  General Appearance: Neatly dressed, pleasant, engaging well and cooperative. Appropriate behavior. Not in any distress. Good relatedness. Not internally stimulated.  Eye Contact::  Good  Speech:  Spontaneous, normal prosody. Normal tone and rate.   Volume:  Normal  Mood:  Euthymic  Affect:  Appropriate and Full Range  Thought Process:  Linear  Orientation:  Full (Time, Place, and Person)  Thought Content:  No delusional theme. No preoccupation with violent thoughts. No negative ruminations. No obsession.  No hallucination in any modality.   Suicidal Thoughts:  No  Homicidal Thoughts:   No  Memory:  Immediate;   Good Recent;   Good Remote;   Good  Judgement:  Good  Insight:  Good  Psychomotor Activity:  Normal  Concentration:  Good  Recall:  Good  Fund of Knowledge:Good  Language: Good  Akathisia:  Negative  Handed:    AIMS (if indicated):     Assets:  Communication Skills Desire for Improvement Housing Resilience Social Support  Sleep:  Number of Hours: 5  Cognition: WNL  ADL's:  Intact   Clinical Assessment::   46 y.o Caucasian female, single, lives with her daughter, on disablity. Background history of MDD, SUD, TBI. [facility] in company of her daughter. She has been feeling very depressed lately. She has been using cocaine to cope. She has been off her medications for about a month. Patient held a gun to own head for seven hours. She was very distraught and tearful at the ER. Main stressors is her family dynamics.  Routine labs are essentially normal, toxicology is negative,  UDS is positive for cocaine and opiates.  Seen today. Patient tells me that mentally she is back to her normal self. Says she is no longer feeling depressed, she is no longer ruminating on the negative things that happened in her life. Patient says now she is back on her medications, she is doing well again. Says she knows better now and would never go off her medications again. Effects of cocaine on her mental health was explored. Patient minimizes her use saying it is only occasionally. I shared with her the chemical events that occur with cocaine. Patient encouraged to stay off psychoactive substances. I probed her further about holding a gun to self as collateral from her  suggested otherwise. Patient then said she did not really have a gun. She is able to think clearly. She is able to focus on task. Her thoughts are not crowded or racing. No evidence of mania. No hallucination in any modality. She is not making any delusional statement. No passivity of will/thought. She is  fully in touch with reality. No thoughts of suicide. No thoughts of homicide. No violent thoughts. No overwhelming anxiety. Patient wants to be back on Mirtazapine as it helped her better with sleep.   Nursing staff reports that patient has been appropriate on the unit. Patient has been interacting well with peers. No behavioral issues. Patient has not voiced any suicidal thoughts. Patient has not been observed to be internally stimulated. Patient has been adherent with treatment recommendations. Patient has been tolerating their medication well.   Patient was discussed at team. Team members feels that patient is back to her baseline level of function. Team agrees with plan to discharge patient today.   Demographic Factors:  Low socioeconomic status  Loss Factors: Loss of significant relationship, Legal issues and Financial problems/change in socioeconomic status  Historical Factors: Prior suicide attempts and Impulsivity  Risk Reduction Factors:   Sense of responsibility to family, Living with another person, especially a relative, Positive social support, Positive therapeutic relationship and Positive coping skills or problem solving skills  Continued Clinical Symptoms:  As above   Cognitive Features That Contribute To Risk:  None    Suicide Risk:  Minimal: No identifiable suicidal ideation.  Patient is not having any thoughts of suicide at this time. Modifiable risk factors targeted during this admission includes depression, chronic pain and substance use. Demographical and historical risk factors cannot be modified. Patient is now engaging well. Patient is reliable and is future oriented. We have buffered patient's support structures. At this point, patient is at low risk of suicide. Patient is aware of the effects of psychoactive substances on decision making process. Patient has been provided with emergency contacts. Patient acknowledges to use resources provided if unforseen  circumstances changes their current risk stratification.    Follow-up Information    [facility] Follow up on 10/27/2017.   Specialty:  Psychology Why:  Please resume group on Wednesday 12/12 at either 10:00AM/1:00PM/or 5:30PM. Medication management appt with your provider on Saturday, 12/15 at 11:00AM. Thank you.  Contact information: [address] [place] [address] [phone]           Plan Of Care/Follow-up recommendations:  1. Continue current psychotropic medications 2. Mental health and addiction follow up as arranged.  3. Discharge in care of her son.  4. Provided limited quantity of prescriptions   Georgiann CockerVincent A Judine Arciniega, MD 10/22/2017, 9:53 AM

## 2017-10-22 NOTE — Progress Notes (Signed)
  [facility] :  Will you be returning to the same living situation after discharge:  Yes,  home At discharge, do you have transportation home?: Yes,  family member likely Do you have the ability to pay for your medications: yes, [facility] medicaid.   Release of information consent forms completed and submitted to medical records by CSW.  Patient to Follow up at: Follow-up Information    [facility] Follow up on 10/27/2017.   Specialty:  Psychology Why:  Please resume group on Wednesday 12/12 at either 10:00AM/1:00PM/or 5:30PM. Medication management appt with your provider on Saturday, 12/15 at 11:00AM. Thank you.  Contact information: [address] [place] [address] [phone]           Next level of care provider has access to [facility] Link:no  Safety Planning and Suicide Prevention discussed: Yes,  SPE completed with pt's son. SPI pamphlet and Mobile Crisis information provided to pt. Verified no guns in her home/no access to guns  Have you used any form of tobacco in the last 30 days? (Cigarettes, Smokeless Tobacco, Cigars, and/or Pipes): Yes  Has patient been referred to the Quitline?: Patient refused referral  Patient has been referred for addiction treatment: Yes  Pulte HomesHeather N Smart, LCSW 10/22/2017, 8:56 AM

## 2018-09-08 DIAGNOSIS — F329 Major depressive disorder, single episode, unspecified: Secondary | ICD-10-CM

## 2018-09-08 DIAGNOSIS — A419 Sepsis, unspecified organism: Secondary | ICD-10-CM

## 2018-09-08 DIAGNOSIS — E876 Hypokalemia: Secondary | ICD-10-CM

## 2018-09-08 DIAGNOSIS — N151 Renal and perinephric abscess: Secondary | ICD-10-CM

## 2018-09-08 DIAGNOSIS — N12 Tubulo-interstitial nephritis, not specified as acute or chronic: Secondary | ICD-10-CM

## 2018-09-08 DIAGNOSIS — I10 Essential (primary) hypertension: Secondary | ICD-10-CM

## 2018-11-28 ENCOUNTER — Inpatient Hospital Stay (HOSPITAL_COMMUNITY)
Admission: AD | Admit: 2018-11-28 | Discharge: 2018-12-03 | DRG: 885 | Disposition: A | Discharge status: Home / Self Care | Payer: Medicaid Other | Source: Intra-hospital | Attending: Psychiatry | Admitting: Psychiatry

## 2018-11-28 ENCOUNTER — Other Ambulatory Visit: Payer: Self-pay

## 2018-11-28 ENCOUNTER — Encounter (HOSPITAL_COMMUNITY): Payer: Self-pay | Admitting: *Deleted

## 2018-11-28 DIAGNOSIS — G4700 Insomnia, unspecified: Secondary | ICD-10-CM | POA: Diagnosis present

## 2018-11-28 DIAGNOSIS — F4310 Post-traumatic stress disorder, unspecified: Secondary | ICD-10-CM | POA: Diagnosis present

## 2018-11-28 DIAGNOSIS — Z590 Homelessness: Secondary | ICD-10-CM

## 2018-11-28 DIAGNOSIS — F17210 Nicotine dependence, cigarettes, uncomplicated: Secondary | ICD-10-CM | POA: Diagnosis present

## 2018-11-28 DIAGNOSIS — Z882 Allergy status to sulfonamides status: Secondary | ICD-10-CM

## 2018-11-28 DIAGNOSIS — F1110 Opioid abuse, uncomplicated: Secondary | ICD-10-CM | POA: Diagnosis present

## 2018-11-28 DIAGNOSIS — R45851 Suicidal ideations: Secondary | ICD-10-CM | POA: Diagnosis present

## 2018-11-28 DIAGNOSIS — F333 Major depressive disorder, recurrent, severe with psychotic symptoms: Principal | ICD-10-CM | POA: Diagnosis present

## 2018-11-28 DIAGNOSIS — Z881 Allergy status to other antibiotic agents status: Secondary | ICD-10-CM

## 2018-11-28 DIAGNOSIS — F1199 Opioid use, unspecified with unspecified opioid-induced disorder: Secondary | ICD-10-CM

## 2018-11-28 DIAGNOSIS — F419 Anxiety disorder, unspecified: Secondary | ICD-10-CM | POA: Diagnosis not present

## 2018-11-28 DIAGNOSIS — F1190 Opioid use, unspecified, uncomplicated: Secondary | ICD-10-CM

## 2018-11-28 DIAGNOSIS — F332 Major depressive disorder, recurrent severe without psychotic features: Secondary | ICD-10-CM | POA: Diagnosis not present

## 2018-11-28 DIAGNOSIS — I10 Essential (primary) hypertension: Secondary | ICD-10-CM | POA: Diagnosis present

## 2018-11-28 DIAGNOSIS — F339 Major depressive disorder, recurrent, unspecified: Secondary | ICD-10-CM | POA: Diagnosis present

## 2018-11-28 MED ORDER — HYDROXYZINE HCL 25 MG PO TABS
25.0000 mg | ORAL_TABLET | Freq: Four times a day (QID) | ORAL | Status: DC | PRN
  Administered 2018-11-28 – 2018-12-03 (×6): 25 mg via ORAL
  Filled 2018-11-28 (×7): qty 1

## 2018-11-28 MED ORDER — MAGNESIUM HYDROXIDE 400 MG/5ML PO SUSP: 30.0000 mL | Freq: Every day | ORAL | Status: DC | PRN

## 2018-11-28 MED ORDER — NICOTINE 21 MG/24HR TD PT24
21.0000 mg | MEDICATED_PATCH | Freq: Every day | TRANSDERMAL | Status: DC
  Administered 2018-11-30 – 2018-12-03 (×3): 21 mg via TRANSDERMAL
  Filled 2018-11-28 (×8): qty 1

## 2018-11-28 MED ORDER — CLONIDINE HCL 0.1 MG PO TABS
0.1000 mg | ORAL_TABLET | ORAL | Status: DC
  Administered 2018-12-01 – 2018-12-02 (×3): 0.1 mg via ORAL
  Filled 2018-11-28 (×4): qty 1

## 2018-11-28 MED ORDER — GABAPENTIN 300 MG PO CAPS
300.0000 mg | ORAL_CAPSULE | Freq: Two times a day (BID) | ORAL | Status: DC
  Administered 2018-11-28 – 2018-12-03 (×10): 300 mg via ORAL
  Filled 2018-11-28 (×16): qty 1

## 2018-11-28 MED ORDER — DICYCLOMINE HCL 20 MG PO TABS: 20.0000 mg | ORAL_TABLET | Freq: Four times a day (QID) | ORAL | Status: DC | PRN

## 2018-11-28 MED ORDER — NAPROXEN 500 MG PO TABS
500.0000 mg | ORAL_TABLET | Freq: Two times a day (BID) | ORAL | Status: AC | PRN
  Administered 2018-11-28 – 2018-12-01 (×4): 500 mg via ORAL
  Filled 2018-11-28 (×4): qty 1

## 2018-11-28 MED ORDER — CLONIDINE HCL 0.1 MG PO TABS
0.1000 mg | ORAL_TABLET | Freq: Every day | ORAL | Status: DC
  Filled 2018-11-28: qty 1

## 2018-11-28 MED ORDER — ONDANSETRON 4 MG PO TBDP: 4.0000 mg | ORAL_TABLET | Freq: Four times a day (QID) | ORAL | Status: AC | PRN

## 2018-11-28 MED ORDER — TRAZODONE HCL 50 MG PO TABS
50.0000 mg | ORAL_TABLET | Freq: Every evening | ORAL | Status: DC | PRN
  Administered 2018-11-30 – 2018-12-01 (×2): 50 mg via ORAL
  Filled 2018-11-28 (×3): qty 1

## 2018-11-28 MED ORDER — CLONIDINE HCL 0.1 MG PO TABS
0.1000 mg | ORAL_TABLET | Freq: Four times a day (QID) | ORAL | Status: AC
  Administered 2018-11-28 – 2018-11-30 (×9): 0.1 mg via ORAL
  Filled 2018-11-28 (×13): qty 1

## 2018-11-28 MED ORDER — QUETIAPINE FUMARATE 200 MG PO TABS
200.0000 mg | ORAL_TABLET | Freq: Every day | ORAL | Status: DC
  Administered 2018-11-28 – 2018-12-01 (×4): 200 mg via ORAL
  Filled 2018-11-28 (×7): qty 1

## 2018-11-28 MED ORDER — ALUM & MAG HYDROXIDE-SIMETH 200-200-20 MG/5ML PO SUSP: 30.0000 mL | ORAL | Status: DC | PRN

## 2018-11-28 MED ORDER — ACETAMINOPHEN 325 MG PO TABS
650.0000 mg | ORAL_TABLET | Freq: Four times a day (QID) | ORAL | Status: DC | PRN
  Administered 2018-11-29 – 2018-12-02 (×4): 650 mg via ORAL
  Filled 2018-11-28 (×4): qty 2

## 2018-11-28 MED ORDER — METHOCARBAMOL 500 MG PO TABS
500.0000 mg | ORAL_TABLET | Freq: Three times a day (TID) | ORAL | Status: AC | PRN
  Administered 2018-11-28 – 2018-12-02 (×6): 500 mg via ORAL
  Filled 2018-11-28 (×6): qty 1

## 2018-11-28 MED ORDER — LOPERAMIDE HCL 2 MG PO CAPS
2.0000 mg | ORAL_CAPSULE | ORAL | Status: DC | PRN
  Administered 2018-11-28: 4 mg via ORAL
  Administered 2018-11-29: 2 mg via ORAL
  Administered 2018-11-29 – 2018-11-30 (×2): 4 mg via ORAL
  Administered 2018-11-30 – 2018-12-01 (×3): 2 mg via ORAL
  Filled 2018-11-28 (×2): qty 2
  Filled 2018-11-28 (×2): qty 1
  Filled 2018-11-28: qty 2
  Filled 2018-11-28 (×2): qty 1

## 2018-11-28 NOTE — Progress Notes (Signed)
Patient ID: Helen Valdez, female   DOB: 1971-07-11, 48 y.o.   MRN: [id number] D: Patient in the dayroom watching TV and interacting with peers. Pt mood and affect appears anxious. Pt reports she came here hoping to be back on suboxone.  Denies  SI/HI/AVH and pain.No behavioral issues noted.  A: Support and encouragement offered as needed. Pt advised about been started on another detox protocol for  withdrawal. Medications administered as prescribed.  R: Patient is safe and cooperative on unit. Will continue to monitor  for safety and stability.
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
## 2018-11-28 NOTE — Tx Team (Signed)
Initial Treatment Plan 11/28/2018 5:28 PM ANAYIA OWSLEY CBS:[id number]    PATIENT STRESSORS: Health problems Medication change or noncompliance   PATIENT STRENGTHS: Ability for insight Average or above average intelligence Capable of independent living General fund of knowledge Motivation for treatment/growth   PATIENT IDENTIFIED PROBLEMS: Depression Suicidal thoughts "Get me back on my medicine and get my mind right"                     DISCHARGE CRITERIA:  Ability to meet basic life and health needs Improved stabilization in mood, thinking, and/or behavior Reduction of life-threatening or endangering symptoms to within safe limits Verbal commitment to aftercare and medication compliance  PRELIMINARY DISCHARGE PLAN: Attend aftercare/continuing care group  PATIENT/FAMILY INVOLVEMENT: This treatment plan has been presented to and reviewed with the patient, AJA BENSEL, and/or family member, .  The patient and family have been given the opportunity to ask questions and make suggestions.  Dior Stepter, [place], [place] 11/28/2018, 5:28 PM

## 2018-11-28 NOTE — Progress Notes (Signed)
Attended group 

## 2018-11-28 NOTE — Progress Notes (Signed)
No active prescription for Suboxone 8 mg as indicated by patient.  RN staff followed up with [facility] [place].  Clinic advised patient was dismissed from practice due nonattendance and follow-up therapy.  Of note + UDS positive for opiates.  [facility]. NP discussed with patient,  Initiating detox protocol patient was agreeable to plan will not restart Suboxone.  MD Cobos made aware of recommendation.  Support encouragement reassurance was provided

## 2018-11-29 DIAGNOSIS — F332 Major depressive disorder, recurrent severe without psychotic features: Secondary | ICD-10-CM

## 2018-11-29 DIAGNOSIS — F419 Anxiety disorder, unspecified: Secondary | ICD-10-CM

## 2018-11-29 DIAGNOSIS — G4700 Insomnia, unspecified: Secondary | ICD-10-CM

## 2018-11-29 DIAGNOSIS — F1199 Opioid use, unspecified with unspecified opioid-induced disorder: Secondary | ICD-10-CM

## 2018-11-29 DIAGNOSIS — F1190 Opioid use, unspecified, uncomplicated: Secondary | ICD-10-CM

## 2018-11-29 LAB — PREGNANCY, URINE: PREG TEST UR: NEGATIVE

## 2018-11-29 MED ORDER — SERTRALINE HCL 25 MG PO TABS
25.0000 mg | ORAL_TABLET | Freq: Every day | ORAL | Status: DC
  Administered 2018-11-29 – 2018-12-01 (×3): 25 mg via ORAL
  Filled 2018-11-29 (×5): qty 1
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
## 2018-11-29 NOTE — BHH Group Notes (Signed)
[facility] Group Therapy 11/29/2018 1:15pm  Type of Therapy: Mental [facility] Presentation  Participation Level: Pt invited. Chose to remain in bed.   Rona Ravens, LCSW 11/29/2018 1:44 PM
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
## 2018-11-29 NOTE — Progress Notes (Signed)
CSW attempted to complete PSA with patient. Patient appears to be sleeping soundly with a washcloth over her eyes; CSW will attempt to complete assessment at a later time.  Enid Cutter, LCSW-A Clinical Social Worker

## 2018-11-29 NOTE — Progress Notes (Signed)
Recreation Therapy Notes  Animal-Assisted Activity (AAA) Program Checklist/Progress Notes Patient Eligibility Criteria Checklist & Daily Group note for Rec Tx Intervention  Date: 1.14.20 Time: 1430 Location: [address] Dayroom   AAA/T Program Assumption of Risk Form signed by Patient/ or Parent Legal Guardian  YES   Patient is free of allergies or sever asthma  YES   Patient reports no fear of animals  YES   Patient reports no history of cruelty to animals  YES   Patient understands his/her participation is voluntary  YES   Patient washes hands before animal contact  YES   Patient washes hands after animal contact  YES         Education: Hand Washing, Appropriate Animal Interaction   Education Outcome: Acknowledges understanding/In group clarification offered/Needs additional education.   Clinical Observations/Feedback: Pt did not attend group.    Helen Valdez, LRT/CTRS         Helen Valdez 11/29/2018 3:41 PM 

## 2018-11-29 NOTE — Plan of Care (Signed)
D: Patient presents depressed, anxious. She is hard of hearing and speaks softly and mumbles. She reports anxiety, depression and hopelessness 10/10. She complains of back pain, administered methocarbamol and naproxen and gave a hot pack. She complains of poor sleep, as she did not receive trazodone because it was 0430 when she arrived. Her appetite is poor, energy low and concentration poor. She complains of tremors, chills, aches, agitation and runny nose. She is on clonidine protocol/detox. Her vitals are stable. Patient denies HI.  A: Patient checked q15 min, and checks reviewed. Reviewed medication changes with patient and educated on side effects. Educated patient on importance of attending group therapy sessions and educated on several coping skills. Encouarged participation in milieu through recreation therapy and attending meals with peers. Support and encouragement provided. Fluids offered. R: Patient receptive to education on medications, and is medication compliant. Patient contracts for safety on the unit. Goal: "getting over withdrawals."

## 2018-11-29 NOTE — Progress Notes (Signed)
CSW attempted to complete PSA a second time. Patient still sleeping, not willing/able to engage in assessment. CSW will follow up with patient tomorrow.  Helen Cutterharlotte Alix Stowers, LCSW-A Clinical Social Worker
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
## 2018-11-30 NOTE — Progress Notes (Signed)
Patient did attend the evening speaker NA meeting.  

## 2018-11-30 NOTE — Progress Notes (Signed)
D    Pt complained of diarrhea and anxeity and pain   She interacts well with others and attends and participates in groups     A   Verbal support given   Medications administered and effectiveness monitored   Q 15 min checks R   Pt is safe at this time

## 2018-11-30 NOTE — Progress Notes (Signed)
[facility] MD Progress Note  11/30/2018 10:41 AM Kreg ShropshireVickie L Nevils  MRN:  [id number] Subjective:  Patient is seen and examined.  Patient is a 48 year old female with a past psychiatric history significant for posttraumatic stress disorder, major depression, COPD, hypertension and opiate dependence who presented [facility] the [facility] emergency department on 11/27/2018 with suicidal ideation, increased anxiety, and auditory hallucinations for the last 30 days.   Objective: Patient is seen and examined.  Patient is 48 year old female with the above-stated past psychiatric history seen in follow-up.  She has remained in the bed a great deal of the hospitalization so far.  She continues to complain of withdrawal symptoms.  Her blood pressure this morning was 102/78, heart rate was 74.  She rated a 9 on her pain scale.  She slept 5.5 hours last night.  She was also seen by treatment team today.  She stated the auditory hallucinations had improved.  She denied any suicidal ideation today.  She continues on the opiate detox protocol, as well as Seroquel 200 mg p.o. nightly and sertraline 25 mg p.o. daily.  Principal Problem: Opioid use disorder (HCC) Diagnosis: Principal Problem:   Opioid use disorder (HCC) Active Problems:   Major depressive disorder, recurrent episode (HCC)  Total Time spent with patient: 15 minutes  Past Psychiatric History: See admission H&P  Past Medical History:  Past Medical History:  Diagnosis Date  . Anxiety   . Depression   . Hypertension    History reviewed. No pertinent surgical history. Family History: History reviewed. No pertinent family history. Family Psychiatric  History: See admission H&P Social History:  Social History   Substance and Sexual Activity  Alcohol Use No     Social History   Substance and Sexual Activity  Drug Use No    Social History   Socioeconomic History  . Marital status: Single    Spouse name: Not on file  . Number of children: Not on  file  . Years of education: Not on file  . Highest education level: Not on file  Occupational History  . Not on file  Social Needs  . Financial resource strain: Not on file  . Food insecurity:    Worry: Not on file    Inability: Not on file  . Transportation needs:    Medical: Not on file    Non-medical: Not on file  Tobacco Use  . Smoking status: Current Every Day Smoker    Types: Cigarettes  . Smokeless tobacco: Never Used  Substance and Sexual Activity  . Alcohol use: No  . Drug use: No  . Sexual activity: Yes  Lifestyle  . Physical activity:    Days per week: Not on file    Minutes per session: Not on file  . Stress: Not on file  Relationships  . Social connections:    Talks on phone: Not on file    Gets together: Not on file    Attends religious service: Not on file    Active member of club or organization: Not on file    Attends meetings of clubs or organizations: Not on file    Relationship status: Not on file  Other Topics Concern  . Not on file  Social History Narrative  . Not on file   Additional Social History:    Pain Medications: none noted Prescriptions: Seroquel, Abilify, Gabapentin, Zoloft Over the Counter: none noted History of alcohol / drug use?: No history of alcohol / drug abuse Longest period of sobriety (  when/how long): Have no past or current use of mind altering substances.                    Sleep: Fair  Appetite:  Good  Current Medications: Current Facility-Administered Medications  Medication Dose Route Frequency Provider Last Rate Last Dose  . acetaminophen (TYLENOL) tablet 650 mg  650 mg Oral Q6H PRN Armandina StammerNwoko, Agnes I, NP   650 mg at 11/29/18 1948  . alum & mag hydroxide-simeth (MAALOX/MYLANTA) 200-200-20 MG/5ML suspension 30 mL  30 mL Oral Q4H PRN Nwoko, Agnes I, NP      . cloNIDine (CATAPRES) tablet 0.1 mg  0.1 mg Oral QID Oneta RackLewis, Tanika N, NP   0.1 mg at 11/30/18 0917   Followed by  . [START ON 12/01/2018] cloNIDine  (CATAPRES) tablet 0.1 mg  0.1 mg Oral Esperanza HeirBH-qamhs Lewis, Tanika N, NP       Followed by  . [START ON 12/03/2018] cloNIDine (CATAPRES) tablet 0.1 mg  0.1 mg Oral QAC breakfast Oneta RackLewis, Tanika N, NP      . dicyclomine (BENTYL) tablet 20 mg  20 mg Oral Q6H PRN Oneta RackLewis, Tanika N, NP      . gabapentin (NEURONTIN) capsule 300 mg  300 mg Oral BID Oneta RackLewis, Tanika N, NP   300 mg at 11/30/18 0917  . hydrOXYzine (ATARAX/VISTARIL) tablet 25 mg  25 mg Oral Q6H PRN Armandina StammerNwoko, Agnes I, NP   25 mg at 11/29/18 2141  . loperamide (IMODIUM) capsule 2-4 mg  2-4 mg Oral PRN Oneta RackLewis, Tanika N, NP   2 mg at 11/29/18 1948  . magnesium hydroxide (MILK OF MAGNESIA) suspension 30 mL  30 mL Oral Daily PRN Nwoko, Agnes I, NP      . methocarbamol (ROBAXIN) tablet 500 mg  500 mg Oral Q8H PRN Oneta RackLewis, Tanika N, NP   500 mg at 11/29/18 1528  . naproxen (NAPROSYN) tablet 500 mg  500 mg Oral BID PRN Oneta RackLewis, Tanika N, NP   500 mg at 11/30/18 0918  . nicotine (NICODERM CQ - dosed in mg/24 hours) patch 21 mg  21 mg Transdermal Q0600 Armandina StammerNwoko, Agnes I, NP   21 mg at 11/30/18 0916  . ondansetron (ZOFRAN-ODT) disintegrating tablet 4 mg  4 mg Oral Q6H PRN Oneta RackLewis, Tanika N, NP      . QUEtiapine (SEROQUEL) tablet 200 mg  200 mg Oral QHS Oneta RackLewis, Tanika N, NP   200 mg at 11/29/18 2141  . sertraline (ZOLOFT) tablet 25 mg  25 mg Oral Daily Antonieta Pertlary,  Lawson, MD   25 mg at 11/30/18 0917  . traZODone (DESYREL) tablet 50 mg  50 mg Oral QHS PRN Armandina StammerNwoko, Agnes I, NP   50 mg at 11/30/18 0142    Lab Results:  Results for orders placed or performed during the hospital encounter of 11/28/18 (from the past 48 hour(s))  Pregnancy, urine     Status: None   Collection Time: 11/28/18  3:34 PM  Result Value Ref Range   Preg Test, Ur NEGATIVE NEGATIVE    Comment:        THE SENSITIVITY OF THIS METHODOLOGY IS >20 mIU/mL. Performed at [facility], [address] [place], [place] [address]     Blood Alcohol level:  No results found for: [facility]  Metabolic  Disorder Labs: No results found for: HGBA1C, MPG No results found for: PROLACTIN No results found for: CHOL, TRIG, HDL, CHOLHDL, VLDL, LDLCALC  Physical Findings: AIMS: Facial and Oral Movements Muscles of Facial  Expression: None, normal Lips and Perioral Area: None, normal Jaw: None, normal Tongue: None, normal,Extremity Movements Upper (arms, wrists, hands, fingers): None, normal Lower (legs, knees, ankles, toes): None, normal, Trunk Movements Neck, shoulders, hips: None, normal, Overall Severity Severity of abnormal movements (highest score from questions above): None, normal Incapacitation due to abnormal movements: None, normal Patient's awareness of abnormal movements (rate only patient's report): No Awareness, Dental Status Current problems with teeth and/or dentures?: No Does patient usually wear dentures?: No  CIWA:  CIWA-Ar Total: 0 COWS:  COWS Total Score: 4  Musculoskeletal: Strength & Muscle Tone: within normal limits Gait & Station: normal Patient leans: N/A  Psychiatric Specialty Exam: Physical Exam  Nursing note and vitals reviewed. Constitutional: She is oriented to person, place, and time. She appears well-developed and well-nourished.  HENT:  Head: Normocephalic and atraumatic.  Respiratory: Effort normal.  Neurological: She is alert and oriented to person, place, and time.    ROS  Blood pressure 102/78, pulse 74, temperature 97.8 F (36.6 C), temperature source Oral, resp. rate 18, height 5\' 3"  (1.6 m), weight 56.7 kg.Body mass index is 22.14 kg/m.  General Appearance: Casual  Eye Contact:  Fair  Speech:  Normal Rate  Volume:  Decreased  Mood:  Anxious  Affect:  Congruent  Thought Process:  Coherent and Descriptions of Associations: Intact  Orientation:  Full (Time, Place, and Person)  Thought Content:  Logical  Suicidal Thoughts:  No  Homicidal Thoughts:  No  Memory:  Immediate;   Fair Recent;   Fair Remote;   Fair  Judgement:  Fair   Insight:  Lacking  Psychomotor Activity:  Decreased  Concentration:  Concentration: Fair and Attention Span: Fair  Recall:  [employer] of Knowledge:  Fair  Language:  Fair  Akathisia:  Negative  Handed:  Right  AIMS (if indicated):     Assets:  Communication Skills Desire for Improvement Physical Health  ADL's:  Intact  Cognition:  WNL  Sleep:  Number of Hours: 5.5     Treatment Plan Summary: Daily contact with patient to assess and evaluate symptoms and progress in treatment, Medication management and Plan : Patient is seen and examined.  Patient is a 48 year old female with the above-stated past psychiatric history who is admitted to the hospital for evaluation and stabilization.  Patient is essentially unchanged.  We will continue her opiate detox protocol.  She also complains of concerns of sexually transmitted diseases.  We will get an HIV, RPR, and hepatitis panel to make sure of her physical safety.  Social work saw the patient in treatment team today, and she is working on the possibility of residential substance abuse treatment.  No other changes. 1.  Continue clonidine for opiate detox protocol for opiate withdrawal. 2.  Continue Bentyl 20 mg p.o. every 6 hours as needed spasms and abdominal cramping. 3.  Continue gabapentin 300 mg p.o. twice daily for anxiety and mood stability. 4.  Continue hydroxyzine 25 mg every 6 hours as needed anxiety. 5.  Continue Robaxin 500 mg every 8 hours as needed muscle spasms. 6.  Continue Naprosyn 500 mg p.o. twice daily as needed pain 7.  Continue Seroquel 200 mg p.o. nightly for sleep and mood stability. 8.  Continue Zoloft 25 mg p.o. daily for mood and anxiety. 9.  Continue trazodone 50 mg p.o. nightly as needed insomnia. 10.  Check HIV, RPR and hepatitis panel. 11.  Disposition planning-in progress.  Antonieta Pert, MD 11/30/2018, 10:41 AM

## 2018-11-30 NOTE — Progress Notes (Signed)
The patient attended the evening A.A.meeting and was appropriate.  

## 2018-11-30 NOTE — Progress Notes (Signed)
Patient ID: Helen Valdez, female   DOB: Apr 29, 1971, 48 y.o.   MRN: [id number]  Pt currently presents with a flat affect and depressed behavior. Presents with signs and symptoms of withdrawal including headache, fatigue and generalized pain.  Pt remains in bed for most of the day. Pt reports to writer that their goal is to "stop going through withdrawal." Pt reports ongoing back pain. Pt reports good sleep with current medication regimen.   Pt provided with medications per providers orders. Pt's labs and vitals were monitored throughout the night. Pt supported emotionally and encouraged to express concerns and questions. Pt educated on medications and encouraged to attend groups today.   Pt's safety ensured with 15 minute and environmental checks. Pt currently denies SI/HI and A/V hallucinations. Pt verbally agrees to seek staff if SI/HI or A/VH occurs and to consult with staff before acting on any harmful thoughts. Will continue POC.

## 2018-11-30 NOTE — Progress Notes (Signed)
D: Patient observed resting in bed this evening. Once awakened for assessment, patient was more visible on the unit. Observed on the phone frequently, at NS with requests often, and medication focused. Patient reports ongoing withdrawal to include anxiety, diarrhea, back pain/spasm and restlessness. COWS scored at an "8" and VS are stable. Patient's affect anxious with congruent mood. Patient looks much older than stated age.  Rates back pain at a 9/10. Patient asking for STD testing and encouraged to speak with the provider tomorrow.  A: Medicated per orders, prn imodium, tylenol, and vistaril given for complaints. Medication education provided. Level III obs in place for safety. Emotional support offered. Patient encouraged to complete Suicide Safety Plan before discharge. Encouraged to attend and participate in unit programming.  Fall prevention plan in place and reviewed with patient as pt is a high fall risk due to detox.   R: Patient verbalizes understanding of POC, falls prevention education. On reassess, patient reported no relief from tylenol and imodium. Patient was however able to sleep after hs meds were admin. Patient denies HI/VH however endorses passive SI and states, "if I left, I don't know if I could be safe." Patient also endorses AH in the form of ringing in the ears and crickets. Patient remains safe on level III obs. Will continue to monitor throughout the night.

## 2018-11-30 NOTE — Tx Team (Signed)
Interdisciplinary Treatment and Diagnostic Plan Update  11/30/2018 Time of Session: 0830AM Helen ShropshireVickie L Valdez MRN: [id number]  Principal Diagnosis: Opioid use disorder [facility](HCC)  Secondary Diagnoses: Principal Problem:   Opioid use disorder [facility](HCC) Active Problems:   Major depressive disorder, recurrent episode (HCC)   Current Medications:  Current Facility-Administered Medications  Medication Dose Route Frequency Provider Last Rate Last Dose  . acetaminophen (TYLENOL) tablet 650 mg  650 mg Oral Q6H PRN Armandina StammerNwoko, Agnes I, NP   650 mg at 11/29/18 1948  . alum & mag hydroxide-simeth (MAALOX/MYLANTA) 200-200-20 MG/5ML suspension 30 mL  30 mL Oral Q4H PRN Nwoko, Agnes I, NP      . cloNIDine (CATAPRES) tablet 0.1 mg  0.1 mg Oral QID Oneta RackLewis, Tanika N, NP   0.1 mg at 11/30/18 0917   Followed by  . [START ON 12/01/2018] cloNIDine (CATAPRES) tablet 0.1 mg  0.1 mg Oral Esperanza HeirBH-qamhs Lewis, Tanika N, NP       Followed by  . [START ON 12/03/2018] cloNIDine (CATAPRES) tablet 0.1 mg  0.1 mg Oral QAC breakfast Oneta RackLewis, Tanika N, NP      . dicyclomine (BENTYL) tablet 20 mg  20 mg Oral Q6H PRN Oneta RackLewis, Tanika N, NP      . gabapentin (NEURONTIN) capsule 300 mg  300 mg Oral BID Oneta RackLewis, Tanika N, NP   300 mg at 11/30/18 0917  . hydrOXYzine (ATARAX/VISTARIL) tablet 25 mg  25 mg Oral Q6H PRN Armandina StammerNwoko, Agnes I, NP   25 mg at 11/29/18 2141  . loperamide (IMODIUM) capsule 2-4 mg  2-4 mg Oral PRN Oneta RackLewis, Tanika N, NP   2 mg at 11/29/18 1948  . magnesium hydroxide (MILK OF MAGNESIA) suspension 30 mL  30 mL Oral Daily PRN Nwoko, Agnes I, NP      . methocarbamol (ROBAXIN) tablet 500 mg  500 mg Oral Q8H PRN Oneta RackLewis, Tanika N, NP   500 mg at 11/29/18 1528  . naproxen (NAPROSYN) tablet 500 mg  500 mg Oral BID PRN Oneta RackLewis, Tanika N, NP   500 mg at 11/30/18 0918  . nicotine (NICODERM CQ - dosed in mg/24 hours) patch 21 mg  21 mg Transdermal Q0600 Armandina StammerNwoko, Agnes I, NP   21 mg at 11/30/18 0916  . ondansetron (ZOFRAN-ODT) disintegrating tablet 4 mg  4 mg  Oral Q6H PRN Oneta RackLewis, Tanika N, NP      . QUEtiapine (SEROQUEL) tablet 200 mg  200 mg Oral QHS Oneta RackLewis, Tanika N, NP   200 mg at 11/29/18 2141  . sertraline (ZOLOFT) tablet 25 mg  25 mg Oral Daily Antonieta Pertlary, Greg Lawson, MD   25 mg at 11/30/18 0917  . traZODone (DESYREL) tablet 50 mg  50 mg Oral QHS PRN Armandina StammerNwoko, Agnes I, NP   50 mg at 11/30/18 0142   PTA Medications: Medications Prior to Admission  Medication Sig Dispense Refill Last Dose  . gabapentin (NEURONTIN) 300 MG capsule Take 1 capsule (300 mg total) by mouth 3 (three) times daily. For agitation (Patient not taking: Reported on 11/29/2018) 90 capsule 0 Not Taking at Unknown time  . lisinopril (PRINIVIL,ZESTRIL) 5 MG tablet Take 1 tablet (5 mg total) by mouth daily. For high blood pressure (Patient not taking: Reported on 11/29/2018) 30 tablet 0 Not Taking at Unknown time  . mirtazapine (REMERON) 7.5 MG tablet Take 1 tablet (7.5 mg total) by mouth at bedtime. for mood control 30 tablet 0   . QUEtiapine (SEROQUEL) 200 MG tablet Take 1 tablet (200 mg total) by mouth at  bedtime as needed. (Patient not taking: Reported on 11/29/2018) 30 tablet 0 Not Taking at Unknown time  . sertraline (ZOLOFT) 50 MG tablet Take 1 tablet (50 mg total) by mouth daily. For mood control (Patient not taking: Reported on 11/29/2018) 30 tablet 0 Not Taking at Unknown time    Patient Stressors: Health problems Medication change or noncompliance  Patient Strengths: Ability for insight Average or above average intelligence Capable of independent living General fund of knowledge Motivation for treatment/growth  Treatment Modalities: Medication Management, Group therapy, Case management,  1 to 1 session with clinician, Psychoeducation, Recreational therapy.   Physician Treatment Plan for Primary Diagnosis: Opioid use disorder (HCC) Long Term Goal(s): Improvement in symptoms so as ready for discharge Improvement in symptoms so as ready for discharge   Short Term Goals:  Ability to identify changes in lifestyle to reduce recurrence of condition will improve Ability to verbalize feelings will improve Ability to disclose and discuss suicidal ideas Ability to demonstrate self-control will improve Ability to identify and develop effective coping behaviors will improve Ability to identify triggers associated with substance abuse/mental health issues will improve  Medication Management: Evaluate patient's response, side effects, and tolerance of medication regimen.  Therapeutic Interventions: 1 to 1 sessions, Unit Group sessions and Medication administration.  Evaluation of Outcomes: Progressing  Physician Treatment Plan for Secondary Diagnosis: Principal Problem:   Opioid use disorder (HCC) Active Problems:   Major depressive disorder, recurrent episode (HCC)  Long Term Goal(s): Improvement in symptoms so as ready for discharge Improvement in symptoms so as ready for discharge   Short Term Goals: Ability to identify changes in lifestyle to reduce recurrence of condition will improve Ability to verbalize feelings will improve Ability to disclose and discuss suicidal ideas Ability to demonstrate self-control will improve Ability to identify and develop effective coping behaviors will improve Ability to identify triggers associated with substance abuse/mental health issues will improve     Medication Management: Evaluate patient's response, side effects, and tolerance of medication regimen.  Therapeutic Interventions: 1 to 1 sessions, Unit Group sessions and Medication administration.  Evaluation of Outcomes: Progressing   RN Treatment Plan for Primary Diagnosis: Opioid use disorder (HCC) Long Term Goal(s): Knowledge of disease and therapeutic regimen to maintain health will improve  Short Term Goals: Ability to remain free from injury will improve, Ability to verbalize frustration and anger appropriately will improve, Ability to demonstrate  self-control and Ability to participate in decision making will improve  Medication Management: RN will administer medications as ordered by provider, will assess and evaluate patient's response and provide education to patient for prescribed medication. RN will report any adverse and/or side effects to prescribing provider.  Therapeutic Interventions: 1 on 1 counseling sessions, Psychoeducation, Medication administration, Evaluate responses to treatment, Monitor vital signs and CBGs as ordered, Perform/monitor CIWA, COWS, AIMS and Fall Risk screenings as ordered, Perform wound care treatments as ordered.  Evaluation of Outcomes: Progressing   LCSW Treatment Plan for Primary Diagnosis: Opioid use disorder (HCC) Long Term Goal(s): Safe transition to appropriate next level of care at discharge, Engage patient in therapeutic group addressing interpersonal concerns.  Short Term Goals: Engage patient in aftercare planning with referrals and resources, Facilitate patient progression through stages of change regarding substance use diagnoses and concerns and Identify triggers associated with mental health/substance abuse issues  Therapeutic Interventions: Assess for all discharge needs, 1 to 1 time with Social worker, Explore available resources and support systems, Assess for adequacy in community support network, Educate family  and significant other(s) on suicide prevention, Complete Psychosocial Assessment, Interpersonal group therapy.  Evaluation of Outcomes: Progressing  Progress in Treatment: Attending groups: No. Pt still experiencing withdrawals and is isolative in room.  Participating in groups: No. Taking medication as prescribed: Yes. Toleration medication: Yes. Family/Significant other contact made: No, will contact:  pt's daughter to complete SPE and obtain collateral information Patient understands diagnosis: Yes. Discussing patient identified problems/goals with staff: Yes. Medical  problems stabilized or resolved: Yes. Denies suicidal/homicidal ideation: Yes. Issues/concerns per patient self-inventory: No. Other:n/a  New problem(s) identified: No, Describe:  n/a  New Short Term/Long Term Goal(s): detox, medication management for mood stabilization; elimination of SI thoughts; development of comprehensive mental wellness/sobriety plan.   Patient Goals:  "to trust my own thoughts again."   Discharge Plan or Barriers: CSW assessing for appropriate referrals--Pt requesting to return to her current provider for outpatient mental health services. ([facility] for medication management and therapy). Pt declined referral for residential treatment or SAIOP. MHAG pamphlet, Mobile Crisis information, and AA/NA information provided to patient for additional community support and resources.   Reason for Continuation of Hospitalization: Anxiety Depression Medication stabilization Withdrawal symptoms  Estimated Length of Stay: Thursday, 12/01/2018  Attendees: Patient: Helen Valdez 11/30/2018 11:35 AM  Physician: Dr. Jola Babinski MD 11/30/2018 11:35 AM  Nursing: Huntley Dec RN; Marchelle Folks RN 11/30/2018 11:35 AM  RN Care Manager:x 11/30/2018 11:35 AM  Social Worker: Corrie Mckusick LCSW 11/30/2018 11:35 AM  Recreational Therapist: x 11/30/2018 11:35 AM  Other: Armandina Stammer NP; Marciano Sequin NP 11/30/2018 11:35 AM  Other:  11/30/2018 11:35 AM  Other: 11/30/2018 11:35 AM    Scribe for Treatment Team: Rona Ravens, LCSW 11/30/2018 11:35 AM

## 2018-11-30 NOTE — Progress Notes (Signed)
Recreation Therapy Notes  Date: 1.15. 20 Time: 0930 Location: [address] Dayroom  Group Topic: Stress Management  Goal Area(s) Addresses:  Patient will identify stress management techniques. Patient will identify benefits of using stress management post d/c.  Intervention: Stress Management  Activity :  Guided Imagery.  LRT introduced the stress management technique of guided imagery.  LRT read a script that lead the patients on a mental vacation to the beach at sunrise.  Patients were to listen and follow along as LRT read script.  Education:  Stress Management, Discharge Planning.   Education Outcome: Acknowledges Education  Clinical Observations/Feedback:  Pt did not attend group.     Caroll Rancher, LRT/CTRS         Lillia Abed, Estephania Licciardi A 11/30/2018 11:00 AM

## 2018-11-30 NOTE — Progress Notes (Addendum)
Patient informed that [facility] in [place] is not able to accept her back as a patient. Patient still wishes to pursue medication assisted opioid treatment. Will refer patient to [facility] in [place], patient agreeable to driving to [place]. Also requesting [facility]  appointment.  Patient will likely discharge tomorrow.  CSW contacted  [facility] crisis line regarding bed availability, patient hoping to stay in their shelter at discharge. Crisis line staff reports there are no beds available to day, but bed availability changes on a daily basis. CSW left print out with [facility] crisis line phone number and encouraged patient to call tomorrow morning.  Enid Cutter, LCSW-A Clinical Social Worker

## 2018-11-30 NOTE — Plan of Care (Signed)
  Problem: Safety: Goal: Periods of time without injury will increase Outcome: Progressing   

## 2018-11-30 NOTE — [facility] Suicide Risk Assessment (Signed)
[facility] INPATIENT:  Family/Significant Other Suicide Prevention Education  Suicide Prevention Education:  Contact Attempts: daughter, Kynslie Calitri [phone]) has been identified by the patient as the family member/significant other with whom the patient will be residing, and identified as the person(s) who will aid the patient in the event of a mental health crisis.  With written consent from the patient, two attempts were made to provide suicide prevention education, prior to and/or following the patient's discharge.  We were unsuccessful in providing suicide prevention education.  A suicide education pamphlet was given to the patient to share with family/significant other.  Date and time of first attempt: 11/30/2018 at 10:50am. Left a HIPPA compliant voicemail with callback information. Date and time of second attempt: to be completed at a later time  Darreld Mclean 11/30/2018, 10:53 AM

## 2018-11-30 NOTE — [facility] Suicide Risk Assessment (Signed)
[facility] INPATIENT:  Family/Significant Other Suicide Prevention Education  Suicide Prevention Education:  Education Completed; daughter, Teshawna Welt [phone]) has been identified by the patient as the family member/significant other with whom the patient will be residing, and identified as the person(s) who will aid the patient in the event of a mental health crisis (suicidal ideations/suicide attempt).  With written consent from the patient, the family member/significant other has been provided the following suicide prevention education, prior to the and/or following the discharge of the patient.  The suicide prevention education provided includes the following:  Suicide risk factors  Suicide prevention and interventions  National Suicide Hotline telephone number  [facility] assessment telephone number  [facility] Emergency Assistance 911  [facility] and/or Residential Mobile Crisis Unit telephone number  Request made of family/significant other to:  Remove weapons (e.g., guns, rifles, knives), all items previously/currently identified as safety concern.    Remove drugs/medications (over-the-counter, prescriptions, illicit drugs), all items previously/currently identified as a safety concern.  The family member/significant other verbalizes understanding of the suicide prevention education information provided.  The family member/significant other agrees to remove the items of safety concern listed above.  Daughter shares that her mom has struggled with addiction for years; with her substance use getting increasingly worse since her husband left in 2017.   Daughter shares that the patient has a pattern of entering the hospital and staying clean for a short period of time (a couple days to a week) and will then associate with the same people and will relapse. Daughter is very supportive of patient, willing to transport patient to appointments. However, patient is  not able to live with daughter due to patient bringing drugs/paraphenial in daughters home within the past year; as the daughter has been in recovery for one year.  Daughter hopes patient will go to inpatient treatment. Patient declined referrals earlier with CSW, CSW will follow up.  Darreld Mclean 11/30/2018, 12:31 PM

## 2018-12-01 MED ORDER — SERTRALINE HCL 50 MG PO TABS
50.0000 mg | ORAL_TABLET | Freq: Every day | ORAL | Status: DC
  Administered 2018-12-02 – 2018-12-03 (×2): 50 mg via ORAL
  Filled 2018-12-01 (×4): qty 1

## 2018-12-01 MED ORDER — MIRTAZAPINE 15 MG PO TABS
15.0000 mg | ORAL_TABLET | Freq: Every day | ORAL | Status: DC
  Administered 2018-12-01 – 2018-12-02 (×2): 15 mg via ORAL
  Filled 2018-12-01 (×4): qty 1

## 2018-12-01 NOTE — Plan of Care (Signed)
Nurse discussed anxiety, depression, coping skills with patient. 

## 2018-12-01 NOTE — Progress Notes (Signed)
[facility] MD Progress Note  12/01/2018 1:20 PM Helen Valdez  MRN:  [id number] Subjective:  Patient is seen and examined. Patient is a 48 year old female with a past psychiatric history significant for posttraumatic stress disorder, major depression, COPD, hypertension and opiate dependence who presented to the [facility] emergency department on 11/27/2018 with suicidal ideation, increased anxiety, and auditory hallucinations for the last 30 days.   Objective: Patient is seen and examined.  Patient is 48 year old female with the above-stated past psychiatric history seen in follow-up.  Patient stated she still feels depressed.  Patient stated she still not sleeping well.  She is only been on the Zoloft for a couple of days.  I tried to explain to her when the medication would kick in.  She is requesting addition of Remeron to her medications.  We discussed substance abuse rehabilitation programs, and I explained to the patient that she is limiting [place] to the area where she will go.  We discussed other facilities that might be available, but she wants to stay in the [place] area.  Social work has discussed this with her daughter, and although her daughter will not allow her to move in with her given her drug history and drug paraphernalia she is willing to bring her back and forth to appointments for her psychiatric conditions as well as substance abuse.  She remains fixated on the Subutex/Suboxone.  We discussed again the fact that she would not be able to return to the clinic she had previously had Suboxone at.  We also discussed again the fact that she realized on admission that she would not be getting Suboxone or Subutex.  She denied any suicidal ideation.  Her vital signs are stable, she is afebrile.  There are no autonomic signs of withdrawal.  She slept 4 hours last night.   Principal Problem: Opioid use disorder (HCC) Diagnosis: Principal Problem:   Opioid use disorder (HCC) Active Problems:  Major depressive disorder, recurrent episode (HCC)  Total Time spent with patient: 20 minutes  Past Psychiatric History: See admission H&P  Past Medical History:  Past Medical History:  Diagnosis Date  . Anxiety   . Depression   . Hypertension    History reviewed. No pertinent surgical history. Family History: History reviewed. No pertinent family history. Family Psychiatric  History: See admission H&P Social History:  Social History   Substance and Sexual Activity  Alcohol Use No     Social History   Substance and Sexual Activity  Drug Use No    Social History   Socioeconomic History  . Marital status: Single    Spouse name: Not on file  . Number of children: Not on file  . Years of education: Not on file  . Highest education level: Not on file  Occupational History  . Not on file  Social Needs  . Financial resource strain: Not on file  . Food insecurity:    Worry: Not on file    Inability: Not on file  . Transportation needs:    Medical: Not on file    Non-medical: Not on file  Tobacco Use  . Smoking status: Current Every Day Smoker    Types: Cigarettes  . Smokeless tobacco: Never Used  Substance and Sexual Activity  . Alcohol use: No  . Drug use: No  . Sexual activity: Yes  Lifestyle  . Physical activity:    Days per week: Not on file    Minutes per session: Not on file  .  Stress: Not on file  Relationships  . Social connections:    Talks on phone: Not on file    Gets together: Not on file    Attends religious service: Not on file    Active member of club or organization: Not on file    Attends meetings of clubs or organizations: Not on file    Relationship status: Not on file  Other Topics Concern  . Not on file  Social History Narrative  . Not on file   Additional Social History:    Pain Medications: none noted Prescriptions: Seroquel, Abilify, Gabapentin, Zoloft Over the Counter: none noted History of alcohol / drug use?: No history of  alcohol / drug abuse Longest period of sobriety (when/how long): Have no past or current use of mind altering substances.                    Sleep: Fair  Appetite:  Fair  Current Medications: Current Facility-Administered Medications  Medication Dose Route Frequency Provider Last Rate Last Dose  . acetaminophen (TYLENOL) tablet 650 mg  650 mg Oral Q6H PRN Armandina Stammer I, NP   650 mg at 11/30/18 1722  . alum & mag hydroxide-simeth (MAALOX/MYLANTA) 200-200-20 MG/5ML suspension 30 mL  30 mL Oral Q4H PRN Nwoko, Agnes I, NP      . cloNIDine (CATAPRES) tablet 0.1 mg  0.1 mg Oral Esperanza Heir, NP   0.1 mg at 12/01/18 0802   Followed by  . [START ON 12/03/2018] cloNIDine (CATAPRES) tablet 0.1 mg  0.1 mg Oral QAC breakfast Oneta Rack, NP      . dicyclomine (BENTYL) tablet 20 mg  20 mg Oral Q6H PRN Oneta Rack, NP      . gabapentin (NEURONTIN) capsule 300 mg  300 mg Oral BID Oneta Rack, NP   300 mg at 12/01/18 0802  . hydrOXYzine (ATARAX/VISTARIL) tablet 25 mg  25 mg Oral Q6H PRN Armandina Stammer I, NP   25 mg at 12/01/18 1219  . loperamide (IMODIUM) capsule 2-4 mg  2-4 mg Oral PRN Oneta Rack, NP   2 mg at 12/01/18 1219  . magnesium hydroxide (MILK OF MAGNESIA) suspension 30 mL  30 mL Oral Daily PRN Nwoko, Agnes I, NP      . methocarbamol (ROBAXIN) tablet 500 mg  500 mg Oral Q8H PRN Oneta Rack, NP   500 mg at 11/30/18 2115  . naproxen (NAPROSYN) tablet 500 mg  500 mg Oral BID PRN Oneta Rack, NP   500 mg at 12/01/18 1219  . nicotine (NICODERM CQ - dosed in mg/24 hours) patch 21 mg  21 mg Transdermal Q0600 Armandina Stammer I, NP   21 mg at 12/01/18 0802  . ondansetron (ZOFRAN-ODT) disintegrating tablet 4 mg  4 mg Oral Q6H PRN Oneta Rack, NP      . QUEtiapine (SEROQUEL) tablet 200 mg  200 mg Oral QHS Oneta Rack, NP   200 mg at 11/30/18 2116  . sertraline (ZOLOFT) tablet 25 mg  25 mg Oral Daily Antonieta Pert, MD   25 mg at 12/01/18 0802  . traZODone  (DESYREL) tablet 50 mg  50 mg Oral QHS PRN Armandina Stammer I, NP   50 mg at 12/01/18 0203    Lab Results: No results found for this or any previous visit (from the past 48 hour(s)).  Blood Alcohol level:  No results found for: [facility]  Metabolic Disorder Labs: No results  found for: HGBA1C, MPG No results found for: PROLACTIN No results found for: CHOL, TRIG, HDL, CHOLHDL, VLDL, LDLCALC  Physical Findings: AIMS: Facial and Oral Movements Muscles of Facial Expression: None, normal Lips and Perioral Area: None, normal Jaw: None, normal Tongue: None, normal,Extremity Movements Upper (arms, wrists, hands, fingers): None, normal Lower (legs, knees, ankles, toes): None, normal, Trunk Movements Neck, shoulders, hips: None, normal, Overall Severity Severity of abnormal movements (highest score from questions above): None, normal Incapacitation due to abnormal movements: None, normal Patient's awareness of abnormal movements (rate only patient's report): No Awareness, Dental Status Current problems with teeth and/or dentures?: No Does patient usually wear dentures?: No  CIWA:  CIWA-Ar Total: 0 COWS:  COWS Total Score: 1  Musculoskeletal: Strength & Muscle Tone: within normal limits Gait & Station: normal Patient leans: N/A  Psychiatric Specialty Exam: Physical Exam  Nursing note and vitals reviewed. Constitutional: She is oriented to person, place, and time. She appears well-developed and well-nourished.  HENT:  Head: Normocephalic and atraumatic.  Respiratory: Effort normal.  Neurological: She is alert and oriented to person, place, and time.    ROS  Blood pressure 115/76, pulse 67, temperature 98.3 F (36.8 C), temperature source Oral, resp. rate 18, height 5\' 3"  (1.6 m), weight 56.7 kg.Body mass index is 22.14 kg/m.  General Appearance: Casual  Eye Contact:  Fair  Speech:  Normal Rate  Volume:  Normal  Mood:  Anxious  Affect:  Congruent  Thought Process:  Coherent and  Descriptions of Associations: Intact  Orientation:  Full (Time, Place, and Person)  Thought Content:  Logical  Suicidal Thoughts:  No  Homicidal Thoughts:  No  Memory:  Immediate;   Fair Recent;   Fair Remote;   Fair  Judgement:  Intact  Insight:  Lacking  Psychomotor Activity:  Increased  Concentration:  Concentration: Fair and Attention Span: Fair  Recall:  [employer]Fair  Fund of Knowledge:  Fair  Language:  Fair  Akathisia:  Negative  Handed:  Right  AIMS (if indicated):     Assets:  Communication Skills Desire for Improvement Physical Health Resilience  ADL's:  Intact  Cognition:  WNL  Sleep:  Number of Hours: 4     Treatment Plan Summary: Daily contact with patient to assess and evaluate symptoms and progress in treatment, Medication management and Plan : Patient is seen and examined.  Patient is a 48 year old female with the above-stated past psychiatric history who is admitted to the hospital for evaluation and stabilization. Patient appears to be having no withdrawal symptoms from the opiates.  She is requesting to go back on Remeron, and I will start that back tonight at 15 mg p.o. nightly.  No other changes in her medications.  It looks like the laboratories for infectious diseases have not been done yet.  I will reorder that for this afternoon.  Otherwise we will plan on discharge tomorrow morning. 1.  Patient has completed clonidine detox protocol. 2.  Continue Bentyl 20 mg every 6 hours as needed abdominal spasms or cramping. 3.  Continue gabapentin 300 mg p.o. twice daily for anxiety and mood stability. 4.  Continue hydroxyzine 25 mg every 6 hours as needed anxiety. 5.  Continue Robaxin 500 mg p.o. every 8 hours as needed muscle spasms. 6.  Continue Naprosyn 500 mg p.o. twice daily as needed for pain. 7.  Continue Seroquel 200 mg p.o. nightly for sleep and mood stability. 8.  Increase Zoloft to 50 mg p.o. daily for mood and anxiety.  9.  Continue trazodone 50 mg p.o. nightly  as needed insomnia. 10.  Add Remeron 15 mg p.o. nightly for mood, sleep and anxiety. 11.  Reorder HIV, RPR and hepatitis panels. 12.  Disposition planning-we will plan for discharge tomorrow.  Antonieta Pert, MD 12/01/2018, 1:20 PM

## 2018-12-01 NOTE — Progress Notes (Addendum)
D: Pt was in dayroom upon initial approach.  Pt presents with depressed affect and mood.  She describes her day as "better than yesterday."  Her goal today was to "get up and go to group and I did.  I got to breakfast this morning too."  Pt denies SI/HI, reports auditory hallucinations of "music," reports chronic back pain of 8/10.  Pt has been visible in milieu interacting with peers and staff appropriately.  Pt attended evening group.    A: Introduced self to pt.  Met with pt 1:1.  Actively listened to pt and offered support and encouragement.  Medications administered per order.  PRN medication administered for pain.  Heat packs provided for pain.  Q15 minute safety checks maintained.  R: Pt is safe on the unit.  Pt is compliant with medications.  Pt verbally contracts for safety.  Will continue to monitor and assess.   

## 2018-12-01 NOTE — Progress Notes (Signed)
LCSW Group Therapy Note 12/01/2018 2:36 PM  Type of Therapy and Topic: Group Therapy: DBT House  Participation Level: Active   Description of Group:  In this group patients will be encouraged to explore  their values, behaviors they want to change, emotions they wish to increase, protective factors, supports, coping skills, and motivational factors. They will be guided to discuss their thoughts, feelings, and behaviors related to these obstacles. The group will be asked to individually process the activity and share their insights with the group. This group will be process-oriented, with patients participating in exploration of their own experiences as well as giving and receiving support and challenge from other group members.   Therapeutic Goals: 1. Patient will identify their values 2. Patient will identify behaviors they wish to modify  3. Patient will identify feelings and emotions they wish to increase 4. Patient will identify strengths, supports, protective factors 5. Patient will identify coping skills 6. Patient will identify goals and motivating factors for change   Summary of Patient Progress Patient participated in a guided deep breathing exercise and completed a DBT worksheet. Patient shares her values are her kids and her faith, patient identified goals of sobriety, and shared her coping skills are breathing exercises.  Therapeutic Modalities:  Dialectical Behavioral Therapy  Motivational Interviewing Relapse Prevention Therapy

## 2018-12-01 NOTE — Progress Notes (Signed)
CSW met with patient to discuss discharge planning. Patient requesting additional days of inpatient care, voices vague SI without a plan, spoke of hoping to have Prozac increased or substituted for another medication. CSW encouraged patient to share concerns with psychiatry.  Patient states she "as out of it" when she declined residential treatment referrals earlier. States she can't leave [place] because her kids are there and she wants to be able to see them when she gets sober and is allowed to see them again.   Patient spoke of a 21 day treatment program in [place], could not provide name and asked CSW to call her former outpatient provider, [facility] to ask the name. "Tillie Rung" at [facility] says the program is [facility]. [facility] residential is 9-12 months and does not include Suboxone.   CSW printed off information for [facility], briefly explained program. Patient declines follow up or referral.   Patient aware she is expected to discharge tomorrow morning. Patient's daughter will likely be able to pick her up, patient states her son currently has her car but cannot drive it today because it "needs the spark wires replaced and he couldn't do that until after work."   Patient does not expect to return to her previous living arrangement, with a friend in [place], due to drugs in the home. Patient not allowed to live with her daughter due to bringing paraphernalia in the home; patient's daughter is in recovery and reports that she has been sober for one year.  Patient says she can call her adult son, also in [place], to ask if she can stay with him until a bed at the women's shelter in [place] opens up. Patient reports her son is working and she can't call him until tonight.  CSW asked patient to prepare for discharge tomorrow and work today to make phone calls and arrangements. Patient declines residential treatment referrals and shelter resources outside of [place].  Patient hoping to follow up with [facility] in [place] for medication assisted opioid treatment, says she can drive herself to appointments after her car is fixed. Patient has a [facility] outpatient appt in [place] established for 12/06/18.  Stephanie Acre, LCSW-A Clinical Social Worker

## 2018-12-01 NOTE — Progress Notes (Signed)
D:  Patient denied HI while talking to nurse today.  Stated she is still SI, contracts for safety, no plan.  Continues to hear must playing off/on  Denied visual hallucinations.   A:  Medications administered per MD orders.  Emotional support and encouragement given patient. R:  Safety maintained with 15 minute checks.

## 2018-12-02 LAB — HEPATITIS PANEL, ACUTE
HCV Ab: <0.1 s/co ratio (ref 0.0–0.9)
Hep A IgM: NEGATIVE
Hep B C IgM: NEGATIVE
Hepatitis B Surface Ag: NEGATIVE

## 2018-12-02 LAB — HIV-1 RNA, QUALITATIVE, TMA: HIV-1 RNA, Qualitative, TMA: NEGATIVE

## 2018-12-02 LAB — RPR: RPR: NONREACTIVE

## 2018-12-02 MED ORDER — CLOTRIMAZOLE 1 % EX SOLN
Freq: Two times a day (BID) | CUTANEOUS | Status: DC
  Administered 2018-12-02: 1 via TOPICAL
  Administered 2018-12-03: 08:00:00 via TOPICAL
  Filled 2018-12-02: qty 10

## 2018-12-02 MED ORDER — QUETIAPINE FUMARATE 300 MG PO TABS
300.0000 mg | ORAL_TABLET | Freq: Every day | ORAL | Status: DC
  Administered 2018-12-02: 300 mg via ORAL
  Filled 2018-12-02 (×3): qty 1

## 2018-12-02 NOTE — [facility] Group Notes (Signed)
LCSW Group Therapy Note   12/02/2018 1:15pm   Type of Therapy and Topic:  Group Therapy:  Overcoming Obstacles   Participation Level:  Did Not Attend--pt invited. Chose to remain in bed.    Description of Group:    In this group patients will be encouraged to explore what they see as obstacles to their own wellness and recovery. They will be guided to discuss their thoughts, feelings, and behaviors related to these obstacles. The group will process together ways to cope with barriers, with attention given to specific choices patients can make. Each patient will be challenged to identify changes they are motivated to make in order to overcome their obstacles. This group will be process-oriented, with patients participating in exploration of their own experiences as well as giving and receiving support and challenge from other group members.   Therapeutic Goals: 1. Patient will identify personal and current obstacles as they relate to admission. 2. Patient will identify barriers that currently interfere with their wellness or overcoming obstacles.  3. Patient will identify feelings, thought process and behaviors related to these barriers. 4. Patient will identify two changes they are willing to make to overcome these obstacles:      Summary of Patient Progress   x   Therapeutic Modalities:   Cognitive Behavioral Therapy Solution Focused Therapy Motivational Interviewing Relapse Prevention Therapy  Rona Ravens, LCSW 12/02/2018 2:54 PM

## 2018-12-02 NOTE — Progress Notes (Signed)
Patient ID: Helen Valdez, female   DOB: 1971-10-07, 48 y.o.   MRN: [id number]   D: Patient pleasant on approach tonight. Reports feeling a little dehydrated but feels her withdrawal symptoms are better. Finished clonidine protocol. Reporting back pain and got Robaxin prn. Denies any current SI. Interacting well with others. A: Staff will continue to monitor on q 15 minute checks, follow treatment plan, and give medications as ordered. R: Cooperative and took medications.

## 2018-12-02 NOTE — Progress Notes (Signed)
CSW spoke with patient at bedside to discuss discharge. Patient states her daughter, Helen Valdez, is able to pick her up tomorrow.  CSW asked if patient had followed up with her son, to see if she could live with him at discharge. Patient is unsure, states she has another friend in [place], "he doesn't do drugs" who she can stay with.   CSW asked if patient had followed up with [facility] to complete intake questions so patient could follow up with medication assisted treatment. Patient has not called.  CSW encouraged patient to follow up with her son or friend and Coy Saunas at [employer].   Enid Cutter, LCSW-A Clinical Social Worker

## 2018-12-02 NOTE — Progress Notes (Signed)
Recreation Therapy Notes  Date: 1.17.20 Time: 0930 Location: 300 Hall Dayroom  Group Topic: Stress Management  Goal Area(s) Addresses:  Patient will identify stress management techniques. Patient will identify benefit of using stress management post d/c.  Intervention:  Stress Management  Activity :  Progressive Muscle Relaxation.  LRT introduced the stress management technique of progressive muscle relaxation.  LRT lead the group in tensing and relaxing each muscle individually.  Patients were to follow along as scrip was read to engage in activity.  Education:  Stress Management, Discharge Planning.   Education Outcome: Acknowledges Education  Clinical Observations/Feedback: Pt did not attend group.     Caroll Rancher, LRT/CTRS         Caroll Rancher A 12/02/2018 11:35 AM

## 2018-12-02 NOTE — Progress Notes (Signed)
Patient ID: Helen Valdez, female   DOB: [birth year], 48 y.o.   MRN: [id number]  Pt currently presents with an anxious affect and guarded behavior. Pt reports ongoing back pain. Preoccupied with somatic symptoms, continues to minimize substance abuse. Pt remains in her room for most of the morning. Pt reports poor sleep with current medication regimen.   Pt provided with medications per providers orders. Pt's labs and vitals were monitored throughout the night. Pt supported emotionally and encouraged to express concerns and questions. Pt educated on medications. Encouraged to attend groups and remain in the milieu. Pt attends lunch today.  Pt's safety ensured with 15 minute and environmental checks. Pt currently denies SI/HI and A/V hallucinations. Pt verbally agrees to seek staff if SI/HI or A/VH occurs and to consult with staff before acting on any harmful thoughts. Will continue POC.

## 2018-12-02 NOTE — Progress Notes (Signed)
[facility] MD Progress Note  12/02/2018 10:12 AM Helen Valdez  MRN:  [id number] Subjective:  Patient is seen and examined. Patient is a 48 year old female with a past psychiatric history significant for posttraumatic stress disorder, major depression, COPD, hypertension and opiate dependence who presented to the [facility] emergency department on 11/27/2018 withsuicidalideation,   Objective: Patient is seen and examined. Patient is 48 year old female with the above-stated past psychiatric history seen in follow-up.    She is doing better today.  She states she is still having some auditory hallucinations, still some sleep difficulty.  She said her family is unable to pick her up today, but will be able to pick her up tomorrow morning.  She also acknowledged that she was glad that she was not getting Suboxone or Subutex, she stated that she felt as though the withdrawal symptoms were getting better and she was feeling better.  She denied any suicidal or homicidal ideation.  Her vital signs are stable, she is afebrile.  She reportedly slept 6.5 hours last night.  We discussed increasing her Seroquel for the music that she hears, but some of this may be related to her hearing deficit.  Principal Problem: Opioid use disorder (HCC) Diagnosis: Principal Problem:   Opioid use disorder (HCC) Active Problems:   Major depressive disorder, recurrent episode (HCC)  Total Time spent with patient: 15 minutes  Past Psychiatric History: See admission H&P  Past Medical History:  Past Medical History:  Diagnosis Date  . Anxiety   . Depression   . Hypertension    History reviewed. No pertinent surgical history. Family History: History reviewed. No pertinent family history. Family Psychiatric  History: See admission H&P Social History:  Social History   Substance and Sexual Activity  Alcohol Use No     Social History   Substance and Sexual Activity  Drug Use No    Social History    Socioeconomic History  . Marital status: Single    Spouse name: Not on file  . Number of children: Not on file  . Years of education: Not on file  . Highest education level: Not on file  Occupational History  . Not on file  Social Needs  . Financial resource strain: Not on file  . Food insecurity:    Worry: Not on file    Inability: Not on file  . Transportation needs:    Medical: Not on file    Non-medical: Not on file  Tobacco Use  . Smoking status: Current Every Day Smoker    Types: Cigarettes  . Smokeless tobacco: Never Used  Substance and Sexual Activity  . Alcohol use: No  . Drug use: No  . Sexual activity: Yes  Lifestyle  . Physical activity:    Days per week: Not on file    Minutes per session: Not on file  . Stress: Not on file  Relationships  . Social connections:    Talks on phone: Not on file    Gets together: Not on file    Attends religious service: Not on file    Active member of club or organization: Not on file    Attends meetings of clubs or organizations: Not on file    Relationship status: Not on file  Other Topics Concern  . Not on file  Social History Narrative  . Not on file   Additional Social History:    Pain Medications: none noted Prescriptions: Seroquel, Abilify, Gabapentin, Zoloft Over the Counter: none noted History of  alcohol / drug use?: No history of alcohol / drug abuse Longest period of sobriety (when/how long): Have no past or current use of mind altering substances.                    Sleep: Fair  Appetite:  Good  Current Medications: Current Facility-Administered Medications  Medication Dose Route Frequency Provider Last Rate Last Dose  . acetaminophen (TYLENOL) tablet 650 mg  650 mg Oral Q6H PRN Armandina Stammer I, NP   650 mg at 12/01/18 2112  . alum & mag hydroxide-simeth (MAALOX/MYLANTA) 200-200-20 MG/5ML suspension 30 mL  30 mL Oral Q4H PRN Nwoko, Agnes I, NP      . cloNIDine (CATAPRES) tablet 0.1 mg  0.1  mg Oral Esperanza Heir, NP   0.1 mg at 12/02/18 0803   Followed by  . [START ON 12/03/2018] cloNIDine (CATAPRES) tablet 0.1 mg  0.1 mg Oral QAC breakfast Oneta Rack, NP      . dicyclomine (BENTYL) tablet 20 mg  20 mg Oral Q6H PRN Oneta Rack, NP      . gabapentin (NEURONTIN) capsule 300 mg  300 mg Oral BID Oneta Rack, NP   300 mg at 12/02/18 0803  . hydrOXYzine (ATARAX/VISTARIL) tablet 25 mg  25 mg Oral Q6H PRN Armandina Stammer I, NP   25 mg at 12/01/18 1219  . loperamide (IMODIUM) capsule 2-4 mg  2-4 mg Oral PRN Oneta Rack, NP   2 mg at 12/01/18 1449  . magnesium hydroxide (MILK OF MAGNESIA) suspension 30 mL  30 mL Oral Daily PRN Nwoko, Agnes I, NP      . methocarbamol (ROBAXIN) tablet 500 mg  500 mg Oral Q8H PRN Oneta Rack, NP   500 mg at 12/01/18 1635  . mirtazapine (REMERON) tablet 15 mg  15 mg Oral QHS Antonieta Pert, MD   15 mg at 12/01/18 2112  . naproxen (NAPROSYN) tablet 500 mg  500 mg Oral BID PRN Oneta Rack, NP   500 mg at 12/01/18 1219  . nicotine (NICODERM CQ - dosed in mg/24 hours) patch 21 mg  21 mg Transdermal Q0600 Armandina Stammer I, NP   21 mg at 12/01/18 0802  . ondansetron (ZOFRAN-ODT) disintegrating tablet 4 mg  4 mg Oral Q6H PRN Oneta Rack, NP      . QUEtiapine (SEROQUEL) tablet 300 mg  300 mg Oral QHS Antonieta Pert, MD      . sertraline (ZOLOFT) tablet 50 mg  50 mg Oral Daily Antonieta Pert, MD      . traZODone (DESYREL) tablet 50 mg  50 mg Oral QHS PRN Armandina Stammer I, NP   50 mg at 12/01/18 0203    Lab Results:  Results for orders placed or performed during the hospital encounter of 11/28/18 (from the past 48 hour(s))  RPR     Status: None   Collection Time: 11/30/18  6:51 PM  Result Value Ref Range   RPR Ser Ql Non Reactive Non Reactive    Comment: (NOTE) Performed At: [facility] [address] [place], [place] [id number] Jolene Schimke MD [id number]   Hepatitis panel, acute     Status: None    Collection Time: 11/30/18  6:51 PM  Result Value Ref Range   Hepatitis B Surface Ag Negative Negative   HCV Ab <0.1 0.0 - 0.9 s/co ratio    Comment: (NOTE)  Negative:     < 0.8                             Indeterminate: 0.8 - 0.9                                  Positive:     > 0.9 The CDC recommends that a positive HCV antibody result be followed up with a HCV Nucleic Acid Amplification test ([id number]). Performed At: [facility] [address] [place], [place] [id number] Jolene SchimkeNagendra Sanjai MD BJ:[id number]    Hep A IgM Negative Negative   Hep B C IgM Negative Negative    Blood Alcohol level:  No results found for: [facility]  Metabolic Disorder Labs: No results found for: HGBA1C, MPG No results found for: PROLACTIN No results found for: CHOL, TRIG, HDL, CHOLHDL, VLDL, LDLCALC  Physical Findings: AIMS: Facial and Oral Movements Muscles of Facial Expression: None, normal Lips and Perioral Area: None, normal Jaw: None, normal Tongue: None, normal,Extremity Movements Upper (arms, wrists, hands, fingers): None, normal Lower (legs, knees, ankles, toes): None, normal, Trunk Movements Neck, shoulders, hips: None, normal, Overall Severity Severity of abnormal movements (highest score from questions above): None, normal Incapacitation due to abnormal movements: None, normal Patient's awareness of abnormal movements (rate only patient's report): No Awareness, Dental Status Current problems with teeth and/or dentures?: No Does patient usually wear dentures?: No  CIWA:  CIWA-Ar Total: 1 COWS:  COWS Total Score: 1  Musculoskeletal: Strength & Muscle Tone: within normal limits Gait & Station: normal Patient leans: N/A  Psychiatric Specialty Exam: Physical Exam  Nursing note and vitals reviewed. Constitutional: She is oriented to person, place, and time. She appears well-developed and well-nourished.  HENT:  Head: Normocephalic and atraumatic.   Respiratory: Effort normal.  Neurological: She is alert and oriented to person, place, and time.    ROS  Blood pressure 117/78, pulse 65, temperature 98.3 F (36.8 C), temperature source Oral, resp. rate 18, height 5\' 3"  (1.6 m), weight 56.7 kg.Body mass index is 22.14 kg/m.  General Appearance: Casual  Eye Contact:  Fair  Speech:  Normal Rate  Volume:  Normal  Mood:  Anxious  Affect:  Congruent  Thought Process:  Coherent and Descriptions of Associations: Intact  Orientation:  Full (Time, Place, and Person)  Thought Content:  Logical  Suicidal Thoughts:  No  Homicidal Thoughts:  No  Memory:  Immediate;   Fair Recent;   Fair Remote;   Fair  Judgement:  Intact  Insight:  Fair  Psychomotor Activity:  Normal  Concentration:  Concentration: Fair and Attention Span: Fair  Recall:  F[employer]Fair  Fund of Knowledge:  Fair  Language:  Fair  Akathisia:  Negative  Handed:  Right  AIMS (if indicated):     Assets:  Communication Skills Desire for Improvement Physical Health Social Support  ADL's:  Intact  Cognition:  WNL  Sleep:  Number of Hours: 6.5     Treatment Plan Summary: Daily contact with patient to assess and evaluate symptoms and progress in treatment, Medication management and Plan : Patient is seen and examined.  Patient is a 48 year old female with the above-stated past psychiatric history who is admitted to the hospital for evaluation and stabilization. Patient appears to be having no withdrawal symptoms from the opiates. She is stable, but continued to have some auditory hallucinations.  I  think a lot of that has to do with her hearing deficit.  I will increase her Seroquel to 300 mg p.o. nightly.  I am going to stop all the medications for her opiate withdrawal.  Her laboratories on hepatitis, HIV and RPR were all negative.  We will plan on discharge tomorrow morning if all goes well. 1.  Discontinue Bentyl 2.  Continue gabapentin 300 mg p.o. 3 times daily for anxiety and  mood stability. 3.  Continue Robaxin 500 mg p.o. every 8 hours as needed pain. 4.  Continue Naprosyn 500 mg p.o. twice daily as needed for pain. 5.  Increase Seroquel to 300 mg p.o. nightly for mood stability and sleep. 6.  Continue use Zoloft 50 mg p.o. daily for mood and anxiety. 7.  Continue trazodone 50 mg p.o. nightly as needed insomnia. 8.  Continue Remeron 15 mg p.o. nightly for mood, sleep and anxiety. 9.  Disposition planning-we will plan discharge on 1/18.  Antonieta Pert, MD 12/02/2018, 10:12 AM

## 2018-12-03 LAB — HEPATITIS PANEL, ACUTE
HCV Ab: <0.1 s/co ratio (ref 0.0–0.9)
Hep A IgM: NEGATIVE
Hep B C IgM: NEGATIVE
Hepatitis B Surface Ag: NEGATIVE

## 2018-12-03 MED ORDER — QUETIAPINE FUMARATE 300 MG PO TABS: 300.0000 mg | ORAL_TABLET | Freq: Every day | ORAL | 0 refills | Status: AC

## 2018-12-03 MED ORDER — GABAPENTIN 300 MG PO CAPS: 300.0000 mg | ORAL_CAPSULE | Freq: Two times a day (BID) | ORAL | 0 refills | Status: AC

## 2018-12-03 MED ORDER — MIRTAZAPINE 15 MG PO TABS: 15.0000 mg | ORAL_TABLET | Freq: Every day | ORAL | 0 refills | Status: AC

## 2018-12-03 MED ORDER — SERTRALINE HCL 50 MG PO TABS: 50.0000 mg | ORAL_TABLET | Freq: Every day | ORAL | 0 refills | Status: AC

## 2018-12-03 MED ORDER — NICOTINE 21 MG/24HR TD PT24: 21.0000 mg | MEDICATED_PATCH | Freq: Every day | TRANSDERMAL | 0 refills | Status: AC

## 2018-12-03 MED ORDER — CLOTRIMAZOLE 1 % EX SOLN: Freq: Two times a day (BID) | CUTANEOUS | 0 refills | Status: AC

## 2018-12-03 MED ORDER — HYDROXYZINE HCL 25 MG PO TABS: 25.0000 mg | ORAL_TABLET | Freq: Four times a day (QID) | ORAL | 0 refills | Status: AC | PRN

## 2018-12-03 MED ORDER — TRAZODONE HCL 50 MG PO TABS: 50.0000 mg | ORAL_TABLET | Freq: Every evening | ORAL | 0 refills | Status: AC | PRN
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
## 2018-12-03 NOTE — Progress Notes (Signed)
Patient is given her dc instructions by this Clinical research associate. They are reviewed with her, she verbalized understanding and she was given cc ( SRA, AVS, SSP and transition record). ALl belongings were returned to her from her locker. She completed her daily assessment and on this she wrote she denied having suicidal ideation and she rated her depression, hopelessness and anxiety " 8/0/10", respectively. She was escorted to bldg entrance and dc'd ambulatory per MD order.

## 2018-12-03 NOTE — [facility] Group Notes (Signed)
[facility] Group Notes: (Clinical Social Work)   12/03/2018      Type of Therapy:  Group Therapy   Participation Level:  Did Not Attend despite MHT prompting   Kerrigan Glendening Grossman-Orr, LCSW 12/03/2018, 11:26 AM     

## 2018-12-03 NOTE — BHH Suicide Risk Assessment (Signed)
[facility] Discharge Suicide Risk Assessment   Principal Problem: Opioid use disorder [facility]) Discharge Diagnoses: Principal Problem:   Opioid use disorder (HCC) Active Problems:   Major depressive disorder, recurrent episode (HCC)   Total Time spent with patient: 15 minutes  Musculoskeletal: Strength & Muscle Tone: within normal limits Gait & Station: normal Patient leans: N/A  Psychiatric Specialty Exam: Review of Systems  All other systems reviewed and are negative.   Blood pressure 117/78, pulse 65, temperature 98.3 F (36.8 C), temperature source Oral, resp. rate 18, height 5\' 3"  (1.6 m), weight 56.7 kg.Body mass index is 22.14 kg/m.  General Appearance: Casual  Eye Contact::  Fair  Speech:  Normal Rate409  Volume:  Normal  Mood:  Anxious  Affect:  Congruent  Thought Process:  Coherent and Descriptions of Associations: Intact  Orientation:  Full (Time, Place, and Person)  Thought Content:  Logical  Suicidal Thoughts:  No  Homicidal Thoughts:  No  Memory:  Immediate;   Fair Recent;   Fair Remote;   Fair  Judgement:  Intact  Insight:  Fair  Psychomotor Activity:  Increased  Concentration:  Fair  Recall:  [employer] of Knowledge:Fair  Language: Fair  Akathisia:  Negative  Handed:  Right  AIMS (if indicated):     Assets:  Communication Skills Desire for Improvement Housing Physical Health Resilience  Sleep:  Number of Hours: 6.75  Cognition: WNL  ADL's:  Intact   Mental Status Per Nursing Assessment::   On Admission:  Suicidal ideation indicated by patient, Self-harm thoughts  Demographic Factors:  Caucasian, Low socioeconomic status and Unemployed  Loss Factors: NA  Historical Factors: Impulsivity  Risk Reduction Factors:   Positive social support  Continued Clinical Symptoms:  Depression:   Comorbid alcohol abuse/dependence Impulsivity Alcohol/Substance Abuse/Dependencies  Cognitive Features That Contribute To Risk:  None    Suicide Risk:   Minimal: No identifiable suicidal ideation.  Patients presenting with no risk factors but with morbid ruminations; may be classified as minimal risk based on the severity of the depressive symptoms  Follow-up Information    [facility]. Go on 12/06/2018.   Why:  Hospital follow up appointment is 1/21 at 1:45p.  Please bring your photo ID, proof of insurance and income, SSN, and discharge paperwork from this hospitalization.  Contact information: [address] [place] [address] [phone]        [facility] Follow up.   Why:  Please follow up with "Rosemary" ext. [phone] to schedule your intake appointment. Contact information: [address], [place], [place] [address] Phone: [phone] Fax: [phone])           Plan Of Care/Follow-up recommendations:  Activity:  ad lib  Antonieta Pert, MD 12/03/2018, 7:27 AM

## 2018-12-03 NOTE — Progress Notes (Signed)
  [facility] Adult Case Management Discharge Plan :  Will you be returning to the same living situation after discharge:  No. Pt is planning to stay with a friend at discharge.  At discharge, do you have transportation home?: Yes,  daughter Do you have the ability to pay for your medications: Yes,  [facility]  Release of information consent forms completed and submitted to medical records by CSW.  Patient to Follow up at: Follow-up Information    [facility], [facility]. Go on 12/06/2018.   Why:  Hospital follow up appointment is 1/21 at 1:45p.  Please bring your photo ID, proof of insurance and income, SSN, and discharge paperwork from this hospitalization.  Contact information: [address] [place] [address] [phone]        [facility] Follow up.   Why:  Please follow up with "Rosemary" ext. [phone] to schedule your intake appointment. Contact information: [address], [place], [place] [address] Phone: [phone] Fax: [phone])           Next level of care provider has access to [facility] Link:no  Safety Planning and Suicide Prevention discussed: Yes,  SPE completed with pt's daughter and with pt. SPI pamphlet and mobile crisis information provided to pt.   Have you used any form of tobacco in the last 30 days? (Cigarettes, Smokeless Tobacco, Cigars, and/or Pipes): Yes  Has patient been referred to the Quitline?: Patient refused referral  Patient has been referred for addiction treatment: Yes  Rona Ravens, LCSW 12/03/2018, 8:19 AM

## 2018-12-04 LAB — RPR: RPR Ser Ql: NONREACTIVE

## 2018-12-04 LAB — HIV-1 RNA, QUALITATIVE, TMA: HIV-1 RNA, Qualitative, TMA: NEGATIVE

## 2021-01-31 ENCOUNTER — Emergency Department (HOSPITAL_BASED_OUTPATIENT_CLINIC_OR_DEPARTMENT_OTHER): Payer: Medicaid Other

## 2021-01-31 ENCOUNTER — Emergency Department (HOSPITAL_COMMUNITY): Payer: Medicaid Other

## 2021-01-31 ENCOUNTER — Other Ambulatory Visit: Payer: Self-pay

## 2021-01-31 ENCOUNTER — Emergency Department (HOSPITAL_COMMUNITY)
Admission: EM | Admit: 2021-01-31 | Discharge: 2021-01-31 | Disposition: A | Discharge status: Home / Self Care | Payer: Medicaid Other | Attending: Emergency Medicine | Admitting: Emergency Medicine

## 2021-01-31 ENCOUNTER — Encounter (HOSPITAL_COMMUNITY): Payer: Self-pay

## 2021-01-31 DIAGNOSIS — R0789 Other chest pain: Secondary | ICD-10-CM | POA: Diagnosis not present

## 2021-01-31 DIAGNOSIS — R609 Edema, unspecified: Secondary | ICD-10-CM | POA: Insufficient documentation

## 2021-01-31 DIAGNOSIS — I10 Essential (primary) hypertension: Secondary | ICD-10-CM | POA: Diagnosis not present

## 2021-01-31 DIAGNOSIS — F17210 Nicotine dependence, cigarettes, uncomplicated: Secondary | ICD-10-CM | POA: Insufficient documentation

## 2021-01-31 DIAGNOSIS — M7989 Other specified soft tissue disorders: Secondary | ICD-10-CM | POA: Diagnosis not present

## 2021-01-31 DIAGNOSIS — Z79899 Other long term (current) drug therapy: Secondary | ICD-10-CM | POA: Insufficient documentation

## 2021-01-31 LAB — CBC
HCT: 47.9 % — ABNORMAL HIGH (ref 36.0–46.0)
Hemoglobin: 16.2 g/dL — ABNORMAL HIGH (ref 12.0–15.0)
MCH: 30.2 pg (ref 26.0–34.0)
MCHC: 33.8 g/dL (ref 30.0–36.0)
MCV: 89.4 fL (ref 80.0–100.0)
Platelets: 122 10*3/uL — ABNORMAL LOW (ref 150–400)
RBC: 5.36 MIL/uL — ABNORMAL HIGH (ref 3.87–5.11)
RDW: 17.1 % — ABNORMAL HIGH (ref 11.5–15.5)
WBC: 9.1 10*3/uL (ref 4.0–10.5)
nRBC: 0 % (ref 0.0–0.2)

## 2021-01-31 LAB — BASIC METABOLIC PANEL
Anion gap: 8 (ref 5–15)
BUN: 7 mg/dL (ref 6–20)
CO2: 27 mmol/L (ref 22–32)
Calcium: 9.3 mg/dL (ref 8.9–10.3)
Chloride: 104 mmol/L (ref 98–111)
Creatinine, Ser: 0.89 mg/dL (ref 0.44–1.00)
GFR, Estimated: >60 mL/min (ref >60)
Glucose, Bld: 113 mg/dL — ABNORMAL HIGH (ref 70–99)
Potassium: 3.7 mmol/L (ref 3.5–5.1)
Sodium: 139 mmol/L (ref 135–145)

## 2021-01-31 LAB — TROPONIN I (HIGH SENSITIVITY)
Troponin I (High Sensitivity): 4 ng/L (ref <18)
Troponin I (High Sensitivity): 3 ng/L (ref <18)

## 2021-01-31 LAB — I-STAT BETA HCG BLOOD, ED (MC, WL, AP ONLY): I-stat hCG, quantitative: <5 m[IU]/mL (ref <5)

## 2021-01-31 MED ORDER — POTASSIUM CHLORIDE ER 10 MEQ PO TBCR: 10.0000 meq | EXTENDED_RELEASE_TABLET | Freq: Every day | ORAL | 0 refills | Status: AC

## 2021-01-31 MED ORDER — FUROSEMIDE 40 MG PO TABS: 40.0000 mg | ORAL_TABLET | Freq: Every day | ORAL | 0 refills | Status: AC

## 2021-01-31 NOTE — Discharge Instructions (Addendum)
The test in the ED are not showing any acute problems with your heart and there are no signs of blood clots in your legs.  Start taking the Lasix medication daily.  The potassium medication is to prevent low potassium levels associated with the Lasix medication.  Try wearing compression stockings during the day.  Follow-up with your primary care doctor to check on your response to this treatment.  Consider following up with a cardiologist because of the episodes of chest pain you have had.

## 2021-01-31 NOTE — ED Triage Notes (Signed)
Left sided chest pain started last night radiating to left shoulder and down to left arm. Not in distress.

## 2021-01-31 NOTE — ED Provider Notes (Signed)
[facility] EMERGENCY DEPARTMENT Provider Note   CSN: [id number] Arrival date & time: 01/31/21  0019     History Chief Complaint  Patient presents with  . Chest Pain    Helen Valdez is a 50 y.o. female.  HPI  HPI: A 50 year old patient with a history of hypertension presents for evaluation of chest pain. Initial onset of pain was more than 6 hours ago. The patient's chest pain is described as heaviness/pressure/tightness and is not worse with exertion. The patient's chest pain is middle- or left-sided, is not well-localized, is not sharp and does radiate to the arms/jaw/neck. The patient does not complain of nausea and denies diaphoresis. The patient has smoked in the past 90 days. The patient has no history of stroke, has no history of peripheral artery disease, denies any history of treated diabetes, has no relevant family history of coronary artery disease (first degree relative at less than age 85), has no history of hypercholesterolemia and does not have an elevated BMI (>=30).  Patient states she has been having issues with leg swelling for several weeks now.  She states that she saw her primary care doctor and was started on a few days of diuretics.  Patient was instructed to not take them regularly.  Patient does not feel like that got any better.  She started having chest pain in the last several days.  Last evening however got worse and was on the left side of her chest rating to her arm.  She is not having any shortness of breath.  No fevers.  No history of heart disease or blood clots.  Patient came to the ED for further evaluation.   Past Medical History:  Diagnosis Date  . Anxiety   . Depression   . Hypertension     Patient Active Problem List   Diagnosis Date Noted  . Opioid use disorder 11/29/2018  . Major depressive disorder, recurrent episode (HCC) 11/28/2018  . TBI (traumatic brain injury) (HCC) 07/15/2016  . PTSD (post-traumatic stress disorder)  07/15/2016  . MDD (major depressive disorder) 07/14/2016  . COPD (chronic obstructive pulmonary disease) (HCC) 07/14/2016  . HTN (hypertension) 07/14/2016  . Tobacco use disorder 07/14/2016    History reviewed. No pertinent surgical history.   OB History   No obstetric history on file.     History reviewed. No pertinent family history.  Social History   Tobacco Use  . Smoking status: Current Every Day Smoker    Types: Cigarettes  . Smokeless tobacco: Never Used  Vaping Use  . Vaping Use: Never used  Substance Use Topics  . Alcohol use: No  . Drug use: No    Home Medications Prior to Admission medications   Medication Sig Start Date End Date Taking? Authorizing Provider  albuterol (VENTOLIN HFA) 108 (90 Base) MCG/ACT inhaler Inhale 2 puffs into the lungs every 6 (six) hours as needed for wheezing or shortness of breath.   Yes [provider]  Buprenorphine HCl-Naloxone HCl (SUBOXONE) 8-2 MG FILM Place 1 Film under the tongue daily as needed (lack of energy).   Yes [provider]  clonazePAM (KLONOPIN) 0.5 MG tablet Take 0.5 mg by mouth 2 (two) times daily as needed for anxiety.   Yes [provider]  cloNIDine (CATAPRES) 0.1 MG tablet Take 1 tablet by mouth at bedtime. 01/14/21  Yes [provider]  furosemide (LASIX) 40 MG tablet Take 1 tablet (40 mg total) by mouth daily. 01/31/21  Yes Lynelle Doctor,  Cletis Athens, MD  gabapentin (NEURONTIN) 600 MG tablet Take 600-1,800 mg by mouth See admin instructions. 3 tablets in the morning and 1 tablet in the evening.   Yes [provider]  hydrOXYzine (ATARAX/VISTARIL) 50 MG tablet Take 100 mg by mouth at bedtime. 01/14/21  Yes [provider]  mirtazapine (REMERON) 15 MG tablet Take 1 tablet (15 mg total) by mouth at bedtime. For mood/sleep 12/03/18  Yes Aldean Baker, NP  potassium chloride (KLOR-CON) 10 MEQ tablet Take 1 tablet (10 mEq total) by mouth daily. 01/31/21  Yes Linwood Dibbles, MD  clotrimazole  (LOTRIMIN) 1 % external solution Apply topically 2 (two) times daily. Patient not taking: Reported on 01/31/2021 12/03/18   Aldean Baker, NP  gabapentin (NEURONTIN) 300 MG capsule Take 1 capsule (300 mg total) by mouth 2 (two) times daily. For anxiety/agitation Patient not taking: Reported on 01/31/2021 12/03/18   Aldean Baker, NP  hydrOXYzine (ATARAX/VISTARIL) 25 MG tablet Take 1 tablet (25 mg total) by mouth every 6 (six) hours as needed for anxiety. Patient not taking: Reported on 01/31/2021 12/03/18   Aldean Baker, NP  nicotine (NICODERM CQ - DOSED IN MG/24 HOURS) 21 mg/24hr patch Place 1 patch (21 mg total) onto the skin daily at 6 (six) AM. (May buy over the counter) Patient not taking: Reported on 01/31/2021 12/03/18   Aldean Baker, NP  QUEtiapine (SEROQUEL) 300 MG tablet Take 1 tablet (300 mg total) by mouth at bedtime. For mood Patient not taking: Reported on 01/31/2021 12/03/18   Aldean Baker, NP  sertraline (ZOLOFT) 50 MG tablet Take 1 tablet (50 mg total) by mouth daily. For mood Patient not taking: Reported on 01/31/2021 12/03/18   Aldean Baker, NP  traZODone (DESYREL) 50 MG tablet Take 1 tablet (50 mg total) by mouth at bedtime as needed for sleep. Patient not taking: Reported on 01/31/2021 12/03/18   Aldean Baker, NP    Allergies    Erythromycin base and Sulfa antibiotics  Review of Systems   Review of Systems  All other systems reviewed and are negative.   Physical Exam Updated Vital Signs BP 111/75 (BP Location: Right Arm)   Pulse 76   Temp 98.4 F (36.9 C) (Oral)   Resp 12   Ht 1.6 m (5\' 3" )   Wt 56.7 kg   SpO2 100%   BMI 22.14 kg/m   Physical Exam Vitals and nursing note reviewed.  Constitutional:      General: She is not in acute distress.    Appearance: She is well-developed.  HENT:     Head: Normocephalic and atraumatic.     Right Ear: External ear normal.     Left Ear: External ear normal.  Eyes:     General: No scleral icterus.       Right eye:  No discharge.        Left eye: No discharge.     Conjunctiva/sclera: Conjunctivae normal.  Neck:     Trachea: No tracheal deviation.  Cardiovascular:     Rate and Rhythm: Normal rate and regular rhythm.  Pulmonary:     Effort: Pulmonary effort is normal. No respiratory distress.     Breath sounds: Normal breath sounds. No stridor. No wheezing or rales.  Abdominal:     General: Bowel sounds are normal. There is no distension.     Palpations: Abdomen is soft.     Tenderness: There is no abdominal tenderness. There is no guarding or rebound.  Musculoskeletal:        General: No tenderness.     Cervical back: Neck supple.     Right lower leg: Edema present.     Left lower leg: Edema present.     Comments: Pitting edema bilateral lower extremities  Skin:    General: Skin is warm and dry.     Findings: No rash.  Neurological:     Mental Status: She is alert.     Cranial Nerves: No cranial nerve deficit (no facial droop, extraocular movements intact, no slurred speech).     Sensory: No sensory deficit.     Motor: No abnormal muscle tone or seizure activity.     Coordination: Coordination normal.     ED Results / Procedures / Treatments   Labs (all labs ordered are listed, but only abnormal results are displayed) Labs Reviewed  BASIC METABOLIC PANEL - Abnormal; Notable for the following components:      Result Value   Glucose, Bld 113 (*)    All other components within normal limits  CBC - Abnormal; Notable for the following components:   RBC 5.36 (*)    Hemoglobin 16.2 (*)    HCT 47.9 (*)    RDW 17.1 (*)    Platelets 122 (*)    All other components within normal limits  I-STAT BETA HCG BLOOD, ED ([facility], [facility], [facility] ONLY)  TROPONIN I (HIGH SENSITIVITY)  TROPONIN I (HIGH SENSITIVITY)    EKG EKG Interpretation  Date/Time:  Friday January 31 2021 00:30:41 EDT Ventricular Rate:  75 PR Interval:  168 QRS Duration: 76 QT Interval:  418 QTC Calculation: 466 R Axis:   43 Text  Interpretation: Normal sinus rhythm Low voltage QRS Borderline ECG No significant change since last tracing Confirmed by Linwood Dibbles [phone]) on 01/31/2021 7:44:56 AM   Radiology DG Chest 2 View  Result Date: 01/31/2021 CLINICAL DATA:  Chest pain EXAM: CHEST - 2 VIEW COMPARISON:  04/04/2018 FINDINGS: No focal opacity or pleural effusion. Normal cardiomediastinal silhouette. No pneumothorax. IMPRESSION: No active cardiopulmonary disease. Electronically Signed   By: Jasmine Pang M.D.   On: 01/31/2021 01:07   VAS [place] LOWER EXTREMITY VENOUS (DVT) (ONLY [facility] & [facility])  Result Date: 01/31/2021  Lower Venous DVT Study Indications: Swelling.  Risk Factors: None identified. Comparison Study: No prior studies. Performing Technologist: Chanda Busing RVT  Examination Guidelines: A complete evaluation includes B-mode imaging, spectral Doppler, color Doppler, and power Doppler as needed of all accessible portions of each vessel. Bilateral testing is considered an integral part of a complete examination. Limited examinations for reoccurring indications may be performed as noted. The reflux portion of the exam is performed with the patient in reverse Trendelenburg.  +---------+---------------+---------+-----------+----------+--------------+ RIGHT    CompressibilityPhasicitySpontaneityPropertiesThrombus Aging +---------+---------------+---------+-----------+----------+--------------+ CFV      Full           Yes      Yes                                 +---------+---------------+---------+-----------+----------+--------------+ SFJ      Full                                                        +---------+---------------+---------+-----------+----------+--------------+ FV Prox  Full                                                        +---------+---------------+---------+-----------+----------+--------------+  FV Mid   Full                                                         +---------+---------------+---------+-----------+----------+--------------+ FV DistalFull                                                        +---------+---------------+---------+-----------+----------+--------------+ PFV      Full                                                        +---------+---------------+---------+-----------+----------+--------------+ POP      Full           Yes      Yes                                 +---------+---------------+---------+-----------+----------+--------------+ PTV      Full                                                        +---------+---------------+---------+-----------+----------+--------------+ PERO     Full                                                        +---------+---------------+---------+-----------+----------+--------------+   +---------+---------------+---------+-----------+----------+--------------+ LEFT     CompressibilityPhasicitySpontaneityPropertiesThrombus Aging +---------+---------------+---------+-----------+----------+--------------+ CFV      Full           Yes      Yes                                 +---------+---------------+---------+-----------+----------+--------------+ SFJ      Full                                                        +---------+---------------+---------+-----------+----------+--------------+ FV Prox  Full                                                        +---------+---------------+---------+-----------+----------+--------------+ FV Mid   Full                                                        +---------+---------------+---------+-----------+----------+--------------+  FV DistalFull                                                        +---------+---------------+---------+-----------+----------+--------------+ PFV      Full                                                         +---------+---------------+---------+-----------+----------+--------------+ POP      Full           Yes      Yes                                 +---------+---------------+---------+-----------+----------+--------------+ PTV      Full                                                        +---------+---------------+---------+-----------+----------+--------------+ PERO     Full                                                        +---------+---------------+---------+-----------+----------+--------------+     Summary: RIGHT: - There is no evidence of deep vein thrombosis in the lower extremity.  - No cystic structure found in the popliteal fossa.  LEFT: - There is no evidence of deep vein thrombosis in the lower extremity.  - No cystic structure found in the popliteal fossa.  *See table(s) above for measurements and observations.    Preliminary     Procedures Procedures   Medications Ordered in ED Medications - No data to display  ED Course  I have reviewed the triage vital signs and the nursing notes.  Pertinent labs & imaging results that were available during my care of the patient were reviewed by me and considered in my medical decision making (see chart for details).  Clinical Course as of 01/31/21 0956  Fri Jan 31, 2021  0940 Doppler study negative for DVT [JK]    Clinical Course User Index [JK] Linwood DibblesKnapp, Katelyn Broadnax, MD   MDM Rules/Calculators/A&P HEAR Score: 3                       Patient is in today with chest pain.  Low risk heart score.  Serial troponins are normal.  Doubt acute coronary syndrome.  No signs of CHF on x-ray although patient does have lower extremity edema.  Suspect this is likely peripheral edema but with her complaints of chest discomfort in the persistent leg swelling will obtain Doppler studies to rule out DVT.  Previous records reviewed and I do see a visit back in April 2021 at another medical facility where the chief complaint was leg  swelling.  Patient's ED work-up is reassuring.  DVT study is negative.  Suspect her symptoms are related to  peripheral edema.  Discussed use of compression stockings and will have her restart the Lasix but have her take it regularly.  Recommend outpatient follow-up with her primary doctor and could consider seeing a cardiologist Final Clinical Impression(s) / ED Diagnoses Final diagnoses:  Peripheral edema    Rx / DC Orders ED Discharge Orders         Ordered    furosemide (LASIX) 40 MG tablet  Daily        01/31/21 0950    potassium chloride (KLOR-CON) 10 MEQ tablet  Daily        01/31/21 0950           Linwood Dibbles, MD 01/31/21 [phone]

## 2021-01-31 NOTE — Progress Notes (Signed)
Bilateral lower extremity venous duplex has been completed. Preliminary results can be found in CV Proc through chart review.  Results were given to Dr. Lynelle Doctor.  01/31/21 9:49 AM Olen Cordial RVT

## 2021-06-19 IMAGING — CR DG CHEST 2V
2 series · 2 of 2 positions shown · non-contrast
Comparison: 04/04/2018

CLINICAL DATA: Chest pain

EXAM:
CHEST - 2 VIEW

[chest pa]
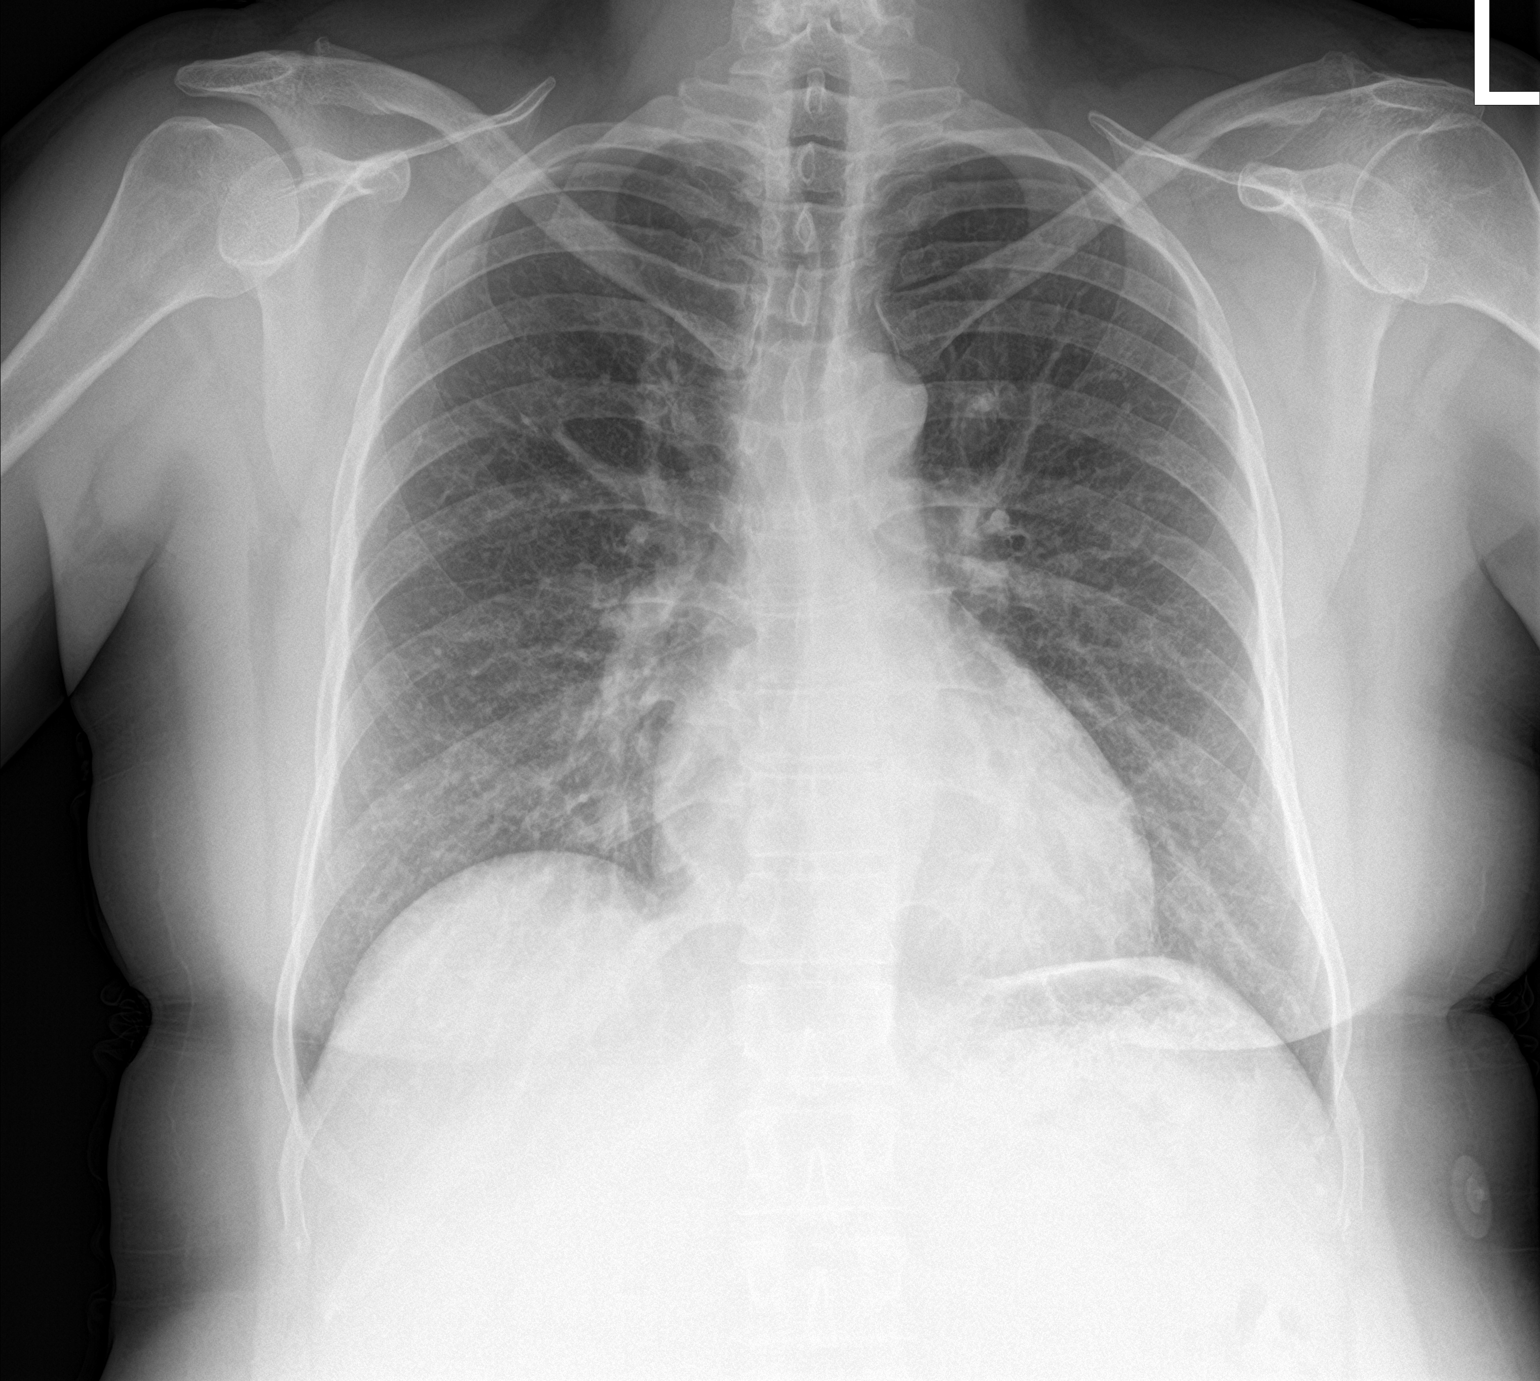

[chest lat]
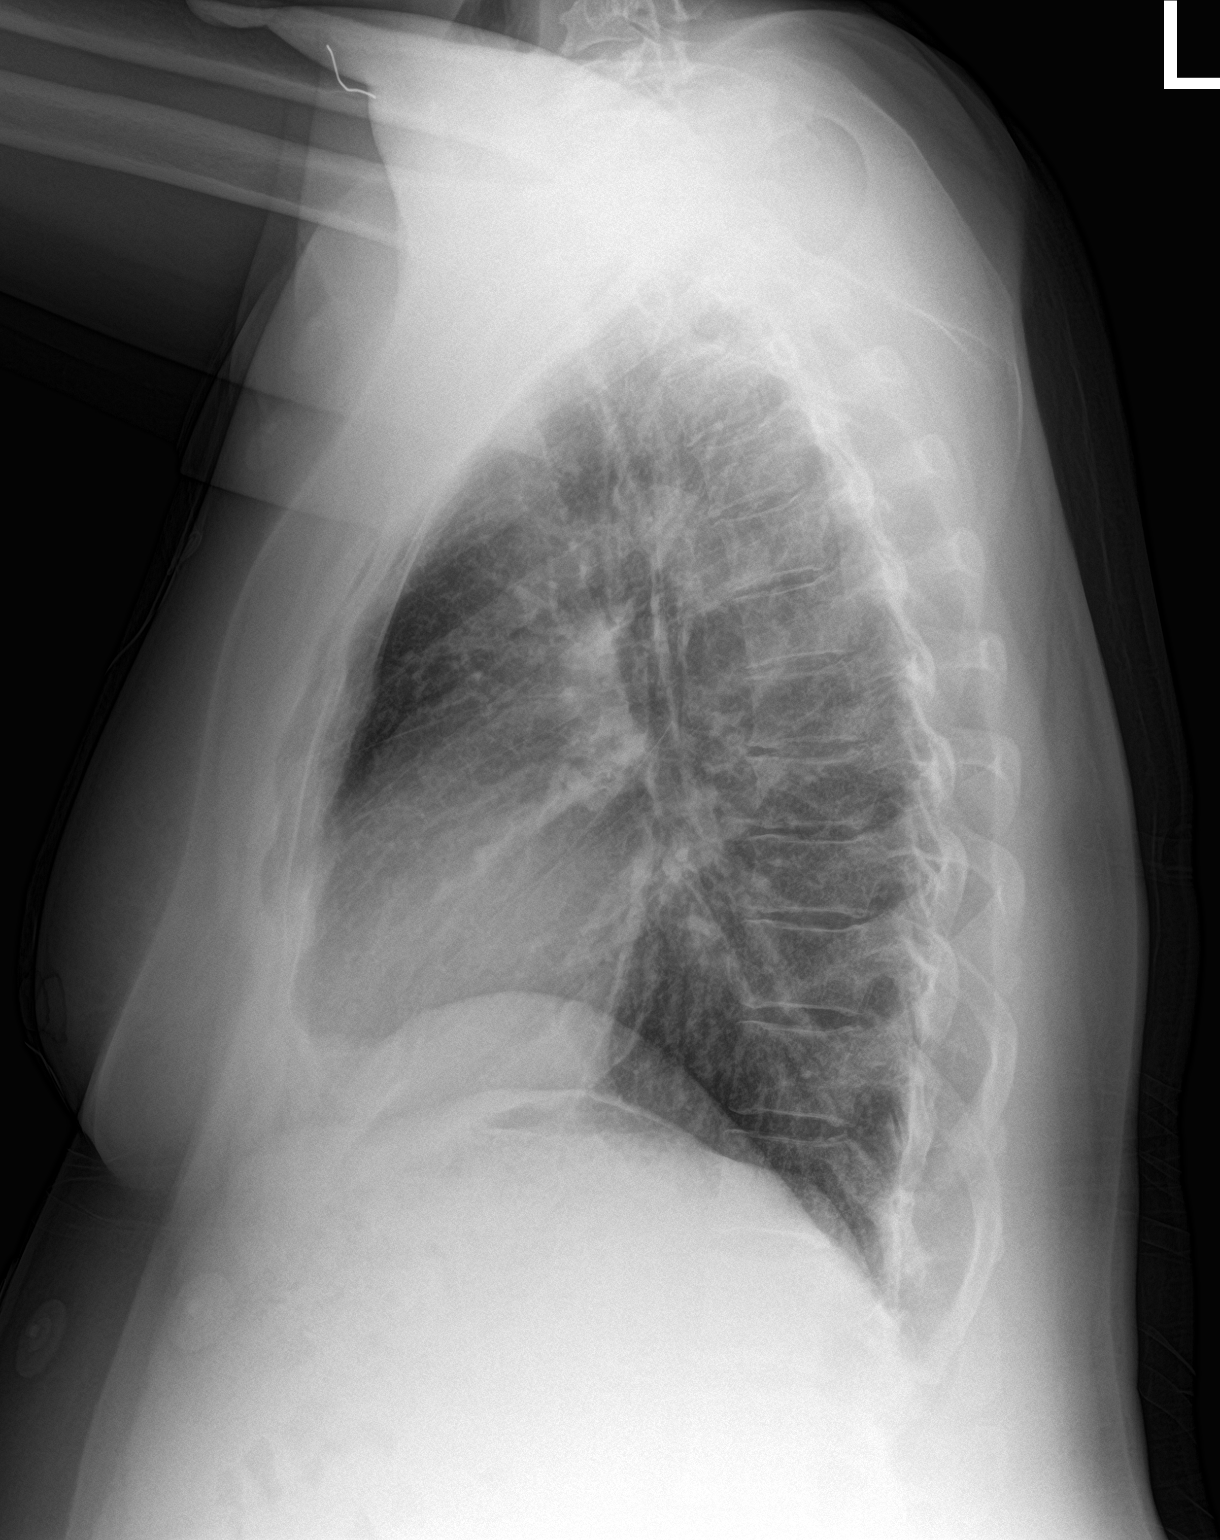

[2 of 2 positions shown; findings below may reference images not displayed]

FINDINGS: No focal opacity or pleural effusion. Normal cardiomediastinal
silhouette. No pneumothorax.
IMPRESSION: No active cardiopulmonary disease.
